# Patient Record
Sex: Female | Born: 1955 | Race: White | Hispanic: No | Marital: Married | Smoking: Never smoker
Health system: Southern US, Community
[De-identification: ages and names within clinical notes are randomized; demographics above are authoritative.]

## PROBLEM LIST (undated history)

## (undated) DIAGNOSIS — I8390 Asymptomatic varicose veins of unspecified lower extremity: Secondary | ICD-10-CM

## (undated) HISTORY — PX: KNEE DEBRIDEMENT: SHX1894

## (undated) HISTORY — DX: Asymptomatic varicose veins of unspecified lower extremity: I83.90

---

## 1999-12-27 ENCOUNTER — Ambulatory Visit (HOSPITAL_COMMUNITY): Admission: RE | Admit: 1999-12-27 | Discharge: 1999-12-27 | Payer: Self-pay | Admitting: Gastroenterology

## 2000-05-28 ENCOUNTER — Encounter: Admission: RE | Admit: 2000-05-28 | Discharge: 2000-05-28 | Payer: Self-pay | Admitting: Family Medicine

## 2000-05-28 ENCOUNTER — Encounter: Payer: Self-pay | Admitting: Family Medicine

## 2001-05-30 ENCOUNTER — Encounter: Admission: RE | Admit: 2001-05-30 | Discharge: 2001-05-30 | Payer: Self-pay | Admitting: Family Medicine

## 2001-05-30 ENCOUNTER — Encounter: Payer: Self-pay | Admitting: Family Medicine

## 2001-12-30 ENCOUNTER — Ambulatory Visit: Admission: RE | Admit: 2001-12-30 | Discharge: 2001-12-30 | Payer: Self-pay | Admitting: Family Medicine

## 2002-06-02 ENCOUNTER — Encounter: Admission: RE | Admit: 2002-06-02 | Discharge: 2002-06-02 | Payer: Self-pay | Admitting: Family Medicine

## 2002-06-02 ENCOUNTER — Encounter: Payer: Self-pay | Admitting: Family Medicine

## 2003-09-16 ENCOUNTER — Encounter: Admission: RE | Admit: 2003-09-16 | Discharge: 2003-09-16 | Payer: Self-pay | Admitting: Family Medicine

## 2003-10-14 ENCOUNTER — Encounter: Admission: RE | Admit: 2003-10-14 | Discharge: 2003-10-14 | Payer: Self-pay | Admitting: *Deleted

## 2004-09-16 ENCOUNTER — Encounter: Admission: RE | Admit: 2004-09-16 | Discharge: 2004-09-16 | Payer: Self-pay | Admitting: Family Medicine

## 2005-02-03 ENCOUNTER — Encounter (INDEPENDENT_AMBULATORY_CARE_PROVIDER_SITE_OTHER): Payer: Self-pay | Admitting: *Deleted

## 2005-02-03 ENCOUNTER — Ambulatory Visit (HOSPITAL_COMMUNITY): Admission: RE | Admit: 2005-02-03 | Discharge: 2005-02-03 | Payer: Self-pay | Admitting: Gastroenterology

## 2005-10-13 ENCOUNTER — Encounter: Admission: RE | Admit: 2005-10-13 | Discharge: 2005-10-13 | Payer: Self-pay | Admitting: Family Medicine

## 2006-10-16 ENCOUNTER — Encounter: Admission: RE | Admit: 2006-10-16 | Discharge: 2006-10-16 | Payer: Self-pay | Admitting: Family Medicine

## 2007-10-18 ENCOUNTER — Encounter: Admission: RE | Admit: 2007-10-18 | Discharge: 2007-10-18 | Payer: Self-pay | Admitting: Family Medicine

## 2008-10-19 ENCOUNTER — Encounter: Admission: RE | Admit: 2008-10-19 | Discharge: 2008-10-19 | Payer: Self-pay | Admitting: Family Medicine

## 2009-10-18 DIAGNOSIS — K219 Gastro-esophageal reflux disease without esophagitis: Secondary | ICD-10-CM | POA: Insufficient documentation

## 2009-10-18 DIAGNOSIS — I10 Essential (primary) hypertension: Secondary | ICD-10-CM | POA: Insufficient documentation

## 2009-10-18 DIAGNOSIS — E039 Hypothyroidism, unspecified: Secondary | ICD-10-CM | POA: Insufficient documentation

## 2009-10-18 DIAGNOSIS — Z79899 Other long term (current) drug therapy: Secondary | ICD-10-CM | POA: Insufficient documentation

## 2009-10-18 DIAGNOSIS — E785 Hyperlipidemia, unspecified: Secondary | ICD-10-CM | POA: Insufficient documentation

## 2009-10-20 ENCOUNTER — Encounter: Admission: RE | Admit: 2009-10-20 | Discharge: 2009-10-20 | Payer: Self-pay | Admitting: Family Medicine

## 2010-10-21 ENCOUNTER — Encounter: Admission: RE | Admit: 2010-10-21 | Discharge: 2010-10-21 | Payer: Self-pay | Admitting: Family Medicine

## 2011-04-07 NOTE — Op Note (Signed)
Sarah Atkins, Sarah Atkins              ACCOUNT NO.:  [id number]   MEDICAL RECORD NO.:  [id number]          PATIENT TYPE:  AMB   LOCATION:  ENDO                         FACILITY:  [facility]   PHYSICIAN:  James L. Malon Kindle., M.D.DATE OF BIRTH:  07/25/1956   DATE OF PROCEDURE:  02/03/2005  DATE OF DISCHARGE:                                 OPERATIVE REPORT   PROCEDURE:  Colonoscopy and coagulation of polyp.   MEDICATIONS:  Fentanyl 75 mcg, Versed 8 mg IV.   SCOPE:  Olympus pediatric adjustable colonoscope.   INDICATIONS:  Patient's father had colon cancer at an age less than 60.  She  had a colonoscopy that was negative five years ago. This is done as a five-  year followup.   DESCRIPTION OF PROCEDURE:  The procedure has been explained to the patient  and consent obtained.  With the patient in the left lateral decubitus  position, the Olympus scope was inserted and advanced.  The prep was  excellent.  I was able to reach the cecum without difficulty using minimal  abdominal pressure.  The ileocecal valve and appendiceal orifice was seen.  In the cecum, some distance from the appendiceal orifice, a 3 mm sessile  polyp was encountered.  It was tented up with a hot biopsy forceps, and a  small amount of cautery applied.  The polyp biopsied.  There was a small  white coagulum.  The scope was withdrawn.  No other polyps were seen in the  ascending or transverse colon.  The splenic flexure, descending and sigmoid  colon were seen well and were free of polyps.  There was no significant  diverticular disease.  The scope was withdrawn down into the rectum, and the  rectum was free of polyps.  The patient tolerated the procedure well.   ASSESSMENT:  Polyp in the cecum, cauterized.  211.3.   PLAN:  Routine post-polypectomy instructions.  Will recommend repeating  procedure in five years.      JLE/MEDQ  D:  02/03/2005  T:  02/03/2005  Job:  [id number]   cc:   Tera Mater. Evlyn Kanner, M.D.  [address]  [place]  [place] [address]  Fax: [phone]

## 2011-04-07 NOTE — Procedures (Signed)
[place]  Patient:    Sarah Atkins                        MRN: [id number] Proc. Date: 12/27/99 Adm. Date:  [id number] Attending:  Orland Mustard CC:         Tera Mater. Evlyn Kanner, M.D.                           Procedure Report  PROCEDURE:  Colonoscopy.  ENDOSCOPIST:  Llana Aliment. Edwards, M.D.  MEDICATIONS:  Fentanyl 100 mcg, Versed 2 mg IV.  SCOPE:  Pediatric Olympus video colonoscope.  INDICATIONS:  A strong family history of colon cancer.  Her father had colon cancer at less than age 4.  DESCRIPTION OF PROCEDURE:  The procedure had been explained to the patient and  consent obtained.  With the patient in the left lateral decubitus position, the  Olympus pediatric video colonoscope was inserted and advanced under direct visualization.  The prep was excellent and we were able to advance around to the cecum.  The right lower quadrant was transilluminated and the ileocecal valve was seen.  The scope was withdrawn.  The cecum, ascending colon, hepatic flexure, transverse colon, splenic flexure, descending colon, and sigmoid colon were seen well upon removal.  No polyps or other lesions were seen.  Internal hemorrhoids  were seen in the rectum upon removal of the scope.  The scope was withdrawn. The patient tolerated the procedure well.  ASSESSMENT: 1. Internal hemorrhoids. 2. No evidence of polyps.  PLAN:  Due to a strong family history, we recommended repeating in five years. DD:  12/27/99 TD:  12/27/99 Job: [id number]

## 2011-09-21 ENCOUNTER — Other Ambulatory Visit: Payer: Self-pay | Admitting: Family Medicine

## 2011-09-21 DIAGNOSIS — Z1231 Encounter for screening mammogram for malignant neoplasm of breast: Secondary | ICD-10-CM

## 2011-10-25 ENCOUNTER — Ambulatory Visit
Admission: RE | Admit: 2011-10-25 | Discharge: 2011-10-25 | Disposition: A | Discharge status: Home / Self Care | Payer: PRIVATE HEALTH INSURANCE | Source: Ambulatory Visit | Attending: Family Medicine | Admitting: Family Medicine

## 2011-10-25 DIAGNOSIS — Z1231 Encounter for screening mammogram for malignant neoplasm of breast: Secondary | ICD-10-CM

## 2011-12-21 DIAGNOSIS — M1990 Unspecified osteoarthritis, unspecified site: Secondary | ICD-10-CM | POA: Insufficient documentation

## 2012-09-19 ENCOUNTER — Other Ambulatory Visit: Payer: Self-pay | Admitting: Family Medicine

## 2012-09-19 DIAGNOSIS — Z1231 Encounter for screening mammogram for malignant neoplasm of breast: Secondary | ICD-10-CM

## 2012-10-28 ENCOUNTER — Ambulatory Visit: Payer: PRIVATE HEALTH INSURANCE

## 2012-11-01 ENCOUNTER — Ambulatory Visit
Admission: RE | Admit: 2012-11-01 | Discharge: 2012-11-01 | Disposition: A | Discharge status: Home / Self Care | Payer: PRIVATE HEALTH INSURANCE | Source: Ambulatory Visit | Attending: Family Medicine | Admitting: Family Medicine

## 2012-11-01 DIAGNOSIS — Z1231 Encounter for screening mammogram for malignant neoplasm of breast: Secondary | ICD-10-CM

## 2013-09-22 ENCOUNTER — Other Ambulatory Visit: Payer: Self-pay

## 2013-09-22 DIAGNOSIS — Z1231 Encounter for screening mammogram for malignant neoplasm of breast: Secondary | ICD-10-CM

## 2013-11-07 ENCOUNTER — Ambulatory Visit: Payer: PRIVATE HEALTH INSURANCE

## 2013-11-10 ENCOUNTER — Ambulatory Visit
Admission: RE | Admit: 2013-11-10 | Discharge: 2013-11-10 | Disposition: A | Discharge status: Home / Self Care | Payer: No Typology Code available for payment source | Source: Ambulatory Visit

## 2013-11-10 DIAGNOSIS — Z1231 Encounter for screening mammogram for malignant neoplasm of breast: Secondary | ICD-10-CM

## 2014-09-01 ENCOUNTER — Other Ambulatory Visit: Payer: Self-pay

## 2014-09-01 ENCOUNTER — Encounter: Payer: Self-pay | Admitting: Vascular Surgery

## 2014-09-01 DIAGNOSIS — M7989 Other specified soft tissue disorders: Secondary | ICD-10-CM

## 2014-09-01 DIAGNOSIS — I8390 Asymptomatic varicose veins of unspecified lower extremity: Secondary | ICD-10-CM

## 2014-09-16 ENCOUNTER — Encounter: Payer: Self-pay | Admitting: Vascular Surgery

## 2014-09-17 ENCOUNTER — Encounter: Payer: Self-pay | Admitting: Vascular Surgery

## 2014-09-17 ENCOUNTER — Ambulatory Visit (HOSPITAL_COMMUNITY)
Admission: RE | Admit: 2014-09-17 | Discharge: 2014-09-17 | Disposition: A | Discharge status: Home / Self Care | Payer: No Typology Code available for payment source | Source: Ambulatory Visit | Attending: Vascular Surgery | Admitting: Vascular Surgery

## 2014-09-17 ENCOUNTER — Ambulatory Visit (INDEPENDENT_AMBULATORY_CARE_PROVIDER_SITE_OTHER): Payer: No Typology Code available for payment source | Admitting: Vascular Surgery

## 2014-09-17 VITALS — BP 114/80 | HR 62 | Ht 64.0 in | Wt 196.1 lb

## 2014-09-17 DIAGNOSIS — I8390 Asymptomatic varicose veins of unspecified lower extremity: Secondary | ICD-10-CM | POA: Diagnosis present

## 2014-09-17 DIAGNOSIS — I83899 Varicose veins of unspecified lower extremities with other complications: Secondary | ICD-10-CM

## 2014-09-17 DIAGNOSIS — M7989 Other specified soft tissue disorders: Secondary | ICD-10-CM | POA: Diagnosis present

## 2014-09-17 NOTE — Progress Notes (Signed)
[facility] [place] HISTORY AND PHYSICAL   History of Present Illness:  Patient is a 58 y.o. year old female who presents for evaluation of recurrent varicose veins right leg. The patient had a vein stripping in her right leg approximately 30 years ago. She has developed slow progressive recurrence of varicose veins in her right leg. She states that her legs swell as the day progresses. She develops fullness achiness and heaviness in the legs. She develops burning and stinging symptoms around the right ankle. She does have some mild similar symptoms in the left leg but the right leg is much worse. She has intermittently worn compression stockings as high as the pantyhose level and as low as knee level. She does get some relief but not full relief from these. She also takes Aleve and elevates her legs to reduce her symptoms. She denies any prior history of DVT. She denies family history. Other medical problems include hypertension which is well-controlled. She has also lost 25 pounds over the last few years intentionally.  Past Medical History  Diagnosis Date  . Varicose veins     Past Surgical History  Procedure Laterality Date  . Knee debridement Right     Social History History  Substance Use Topics  . Smoking status: Never Smoker   . Smokeless tobacco: Never Used  . Alcohol Use: No    Family History Family History  Problem Relation Age of Onset  . Diabetes Mother   . Cancer Mother   . Hypertension Mother   . Hypertension Father     Allergies  No Known Allergies   Current Outpatient Prescriptions  Medication Sig Dispense Refill  . benazepril (LOTENSIN) 20 MG tablet Take 20 mg by mouth daily.      . Cholecalciferol (VITAMIN D3) 2000 UNITS TABS Take 1 tablet by mouth once a week.      . hydrochlorothiazide (HYDRODIURIL) 25 MG tablet Take 25 mg by mouth daily.      Marland Kitchen levothyroxine (SYNTHROID, LEVOTHROID) 175 MCG tablet Take 175 mcg by mouth daily before  breakfast.      . Multiple Vitamin (MULTIVITAMIN) tablet Take 1 tablet by mouth daily.      . naproxen sodium (ANAPROX) 220 MG tablet Take 220 mg by mouth 2 (two) times daily with a meal.       No current facility-administered medications for this visit.    ROS:   General:   + weight loss, Fever, chills  HEENT: No recent headaches, no nasal bleeding, no visual changes, no sore throat  Neurologic: No dizziness, blackouts, seizures. No recent symptoms of stroke or mini- stroke. No recent episodes of slurred speech, or temporary blindness.  Cardiac: No recent episodes of chest pain/pressure, no shortness of breath at rest.  No shortness of breath with exertion.  Denies history of atrial fibrillation or irregular heartbeat  Vascular: No history of rest pain in feet.  No history of claudication.  No history of non-healing ulcer, No history of DVT   Pulmonary: No home oxygen, no productive cough, no hemoptysis,  No asthma or wheezing  Musculoskeletal:  [ ]  Arthritis, [ ]  Low back pain,  [ ]  Joint pain  Hematologic:No history of hypercoagulable state.  No history of easy bleeding.  No history of anemia  Gastrointestinal: No hematochezia or melena,  No gastroesophageal reflux, no trouble swallowing  Urinary: [ ]  chronic Kidney disease, [ ]  on HD - [ ]  MWF or [ ]  TTHS, [ ]  Burning  with urination, [ ]  Frequent urination, [ ]  Difficulty urinating;   Skin: No rashes  Psychological: No history of anxiety,  No history of depression   Physical Examination  Filed Vitals:   09/17/14 1030  BP: 114/80  Pulse: 62  Height: 5\' 4"  (1.626 m)  Weight: 196 lb 1.6 oz (88.95 kg)  SpO2: 100%    Body mass index is 33.64 kg/(m^2).  General:  Alert and oriented, no acute distress HEENT: Normal Neck: No bruit or JVD Pulmonary: Clear to auscultation bilaterally Cardiac: Regular Rate and Rhythm without murmur Abdomen: Soft, non-tender, non-distended, no mass Skin: No rash, diffuse 4-6 mm scattered  varicosities in the right anterior thigh and right medial calf right posterior knee and over the course of the left greater saphenous vein also diffuse multiple patches of reticular and spider-type veins on the anterior thigh both legs and the ankle both sides right greater than left Extremity Pulses:  2+ radial, brachial, femoral, dorsalis pedis pulses bilaterally Musculoskeletal: No deformity or edema  Neurologic: Upper and lower extremity motor 5/5 and symmetric  DATA: Patient had a venous duplex exam today which showed multiple large recurrent varicosities in the right leg 4-7 mm diameter the native greater saphenous was absent and she also had evidence of deep vein reflux. The right saphenofemoral junction was 9 mm.  In the left lower extremity she also had deep vein reflux. She also had reflux in her left greater saphenous vein 6-7 mm diameter   ASSESSMENT:  Recurrent varicose veins right lower extremity. She was given a prescription today for thigh-high 20-30 mm compression stockings to see if she can get relief from the thigh-high type stockings. She will follow up with [place] in 3 months to consider stab avulsion of several of these varicosities. In the right leg she might not be a candidate for the laser due to severe tortuosity. However several of these varicosities seemed to be in continuity. She may also be a candidate for ligation of her saphenofemoral junction. Currently the right leg bothers her the most and she is most interested in treating this. She will continue with leg elevation and nonsteroidal anti-inflammatories for symptomatic relief.  PLAN:  See above  Ruta Hinds, MD [facility] [place] Office: [phone] Pager: [phone]

## 2014-10-09 ENCOUNTER — Other Ambulatory Visit: Payer: Self-pay

## 2014-10-09 DIAGNOSIS — Z1231 Encounter for screening mammogram for malignant neoplasm of breast: Secondary | ICD-10-CM

## 2014-10-12 DIAGNOSIS — K635 Polyp of colon: Secondary | ICD-10-CM | POA: Insufficient documentation

## 2014-10-12 DIAGNOSIS — I8390 Asymptomatic varicose veins of unspecified lower extremity: Secondary | ICD-10-CM | POA: Insufficient documentation

## 2014-11-19 ENCOUNTER — Ambulatory Visit
Admission: RE | Admit: 2014-11-19 | Discharge: 2014-11-19 | Disposition: A | Discharge status: Home / Self Care | Payer: PRIVATE HEALTH INSURANCE | Source: Ambulatory Visit

## 2014-11-19 DIAGNOSIS — Z1231 Encounter for screening mammogram for malignant neoplasm of breast: Secondary | ICD-10-CM

## 2014-12-21 ENCOUNTER — Encounter: Payer: Self-pay | Admitting: Vascular Surgery

## 2014-12-22 ENCOUNTER — Ambulatory Visit: Payer: No Typology Code available for payment source | Admitting: Vascular Surgery

## 2015-10-11 ENCOUNTER — Other Ambulatory Visit: Payer: Self-pay

## 2015-10-11 DIAGNOSIS — Z1231 Encounter for screening mammogram for malignant neoplasm of breast: Secondary | ICD-10-CM

## 2015-11-16 ENCOUNTER — Ambulatory Visit
Admission: RE | Admit: 2015-11-16 | Discharge: 2015-11-16 | Disposition: A | Discharge status: Home / Self Care | Payer: 59 | Source: Ambulatory Visit

## 2015-11-16 DIAGNOSIS — Z1231 Encounter for screening mammogram for malignant neoplasm of breast: Secondary | ICD-10-CM

## 2016-10-16 ENCOUNTER — Other Ambulatory Visit: Payer: Self-pay | Admitting: Endocrinology

## 2016-10-16 DIAGNOSIS — Z1231 Encounter for screening mammogram for malignant neoplasm of breast: Secondary | ICD-10-CM

## 2016-11-16 ENCOUNTER — Ambulatory Visit
Admission: RE | Admit: 2016-11-16 | Discharge: 2016-11-16 | Disposition: A | Discharge status: Home / Self Care | Payer: BLUE CROSS/BLUE SHIELD | Source: Ambulatory Visit | Attending: Endocrinology | Admitting: Endocrinology

## 2016-11-16 DIAGNOSIS — Z1231 Encounter for screening mammogram for malignant neoplasm of breast: Secondary | ICD-10-CM

## 2017-10-05 ENCOUNTER — Other Ambulatory Visit: Payer: Self-pay | Admitting: Endocrinology

## 2017-10-05 DIAGNOSIS — Z1231 Encounter for screening mammogram for malignant neoplasm of breast: Secondary | ICD-10-CM

## 2017-10-23 DIAGNOSIS — E310 Autoimmune polyglandular failure: Secondary | ICD-10-CM | POA: Insufficient documentation

## 2017-10-23 DIAGNOSIS — D751 Secondary polycythemia: Secondary | ICD-10-CM | POA: Insufficient documentation

## 2017-11-19 ENCOUNTER — Ambulatory Visit: Payer: BLUE CROSS/BLUE SHIELD

## 2017-12-10 ENCOUNTER — Ambulatory Visit
Admission: RE | Admit: 2017-12-10 | Discharge: 2017-12-10 | Disposition: A | Discharge status: Home / Self Care | Payer: BLUE CROSS/BLUE SHIELD | Source: Ambulatory Visit | Attending: Endocrinology | Admitting: Endocrinology

## 2017-12-10 DIAGNOSIS — Z1231 Encounter for screening mammogram for malignant neoplasm of breast: Secondary | ICD-10-CM

## 2018-10-21 DIAGNOSIS — Z0000 Encounter for general adult medical examination without abnormal findings: Secondary | ICD-10-CM | POA: Insufficient documentation

## 2018-10-31 ENCOUNTER — Other Ambulatory Visit: Payer: Self-pay | Admitting: Endocrinology

## 2018-10-31 DIAGNOSIS — Z1231 Encounter for screening mammogram for malignant neoplasm of breast: Secondary | ICD-10-CM

## 2018-12-11 ENCOUNTER — Ambulatory Visit
Admission: RE | Admit: 2018-12-11 | Discharge: 2018-12-11 | Disposition: A | Discharge status: Home / Self Care | Payer: BLUE CROSS/BLUE SHIELD | Source: Ambulatory Visit | Attending: Endocrinology | Admitting: Endocrinology

## 2018-12-11 DIAGNOSIS — Z1231 Encounter for screening mammogram for malignant neoplasm of breast: Secondary | ICD-10-CM

## 2019-01-26 IMAGING — MG DIGITAL SCREENING BILATERAL MAMMOGRAM WITH CAD
6 series · 6 of 6 positions shown · non-contrast
Comparison: Previous exam(s).

CLINICAL DATA: Screening.

EXAM:
DIGITAL SCREENING BILATERAL MAMMOGRAM WITH CAD

[L MLO (1 of 2)]
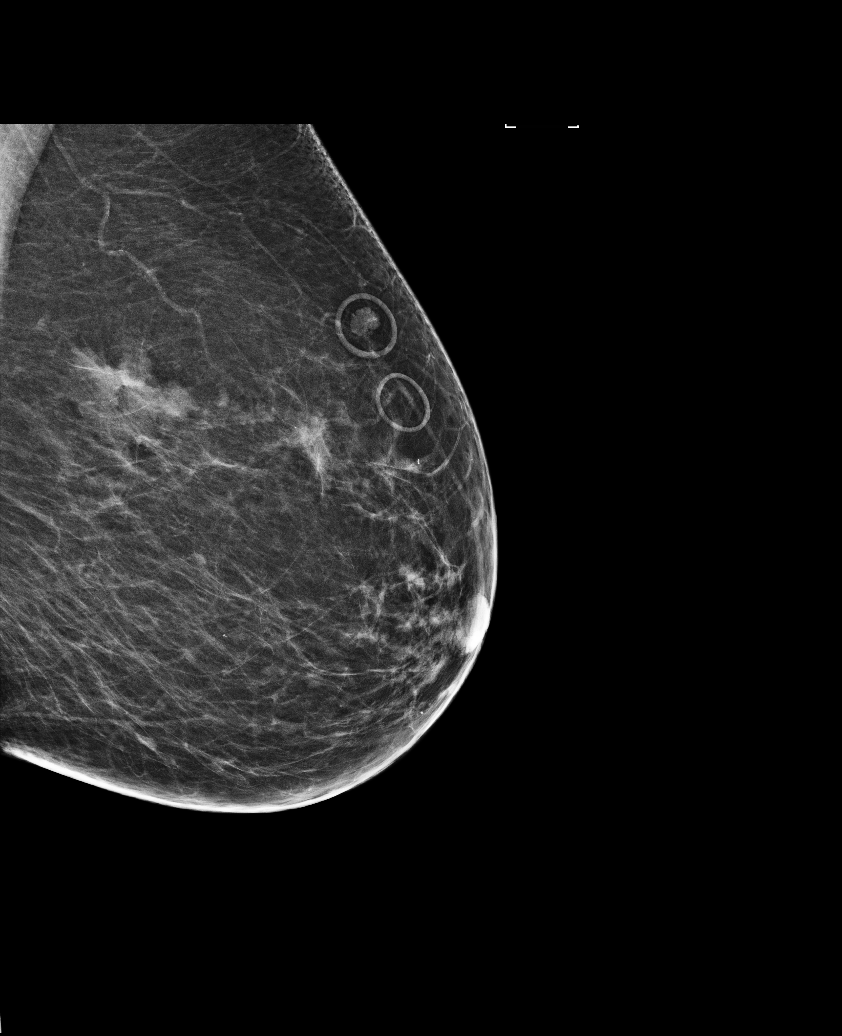

[L CC]
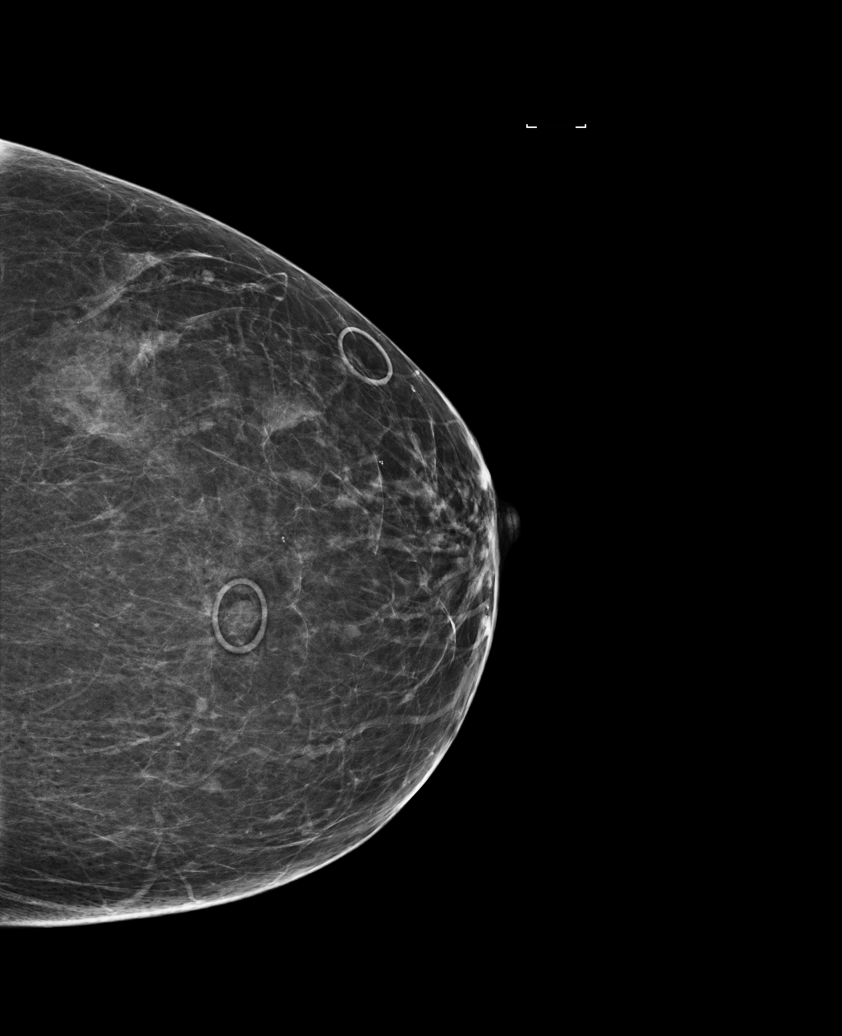

[R MLO (1 of 2)]
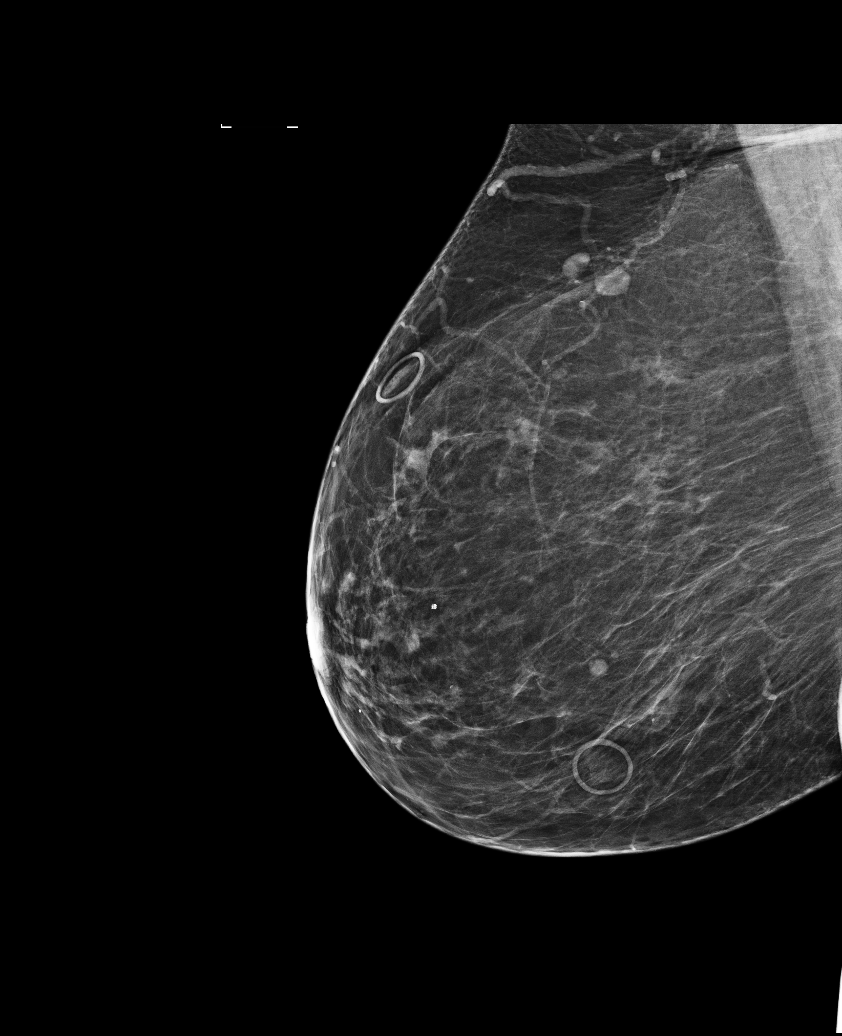

[R MLO (2 of 2)]
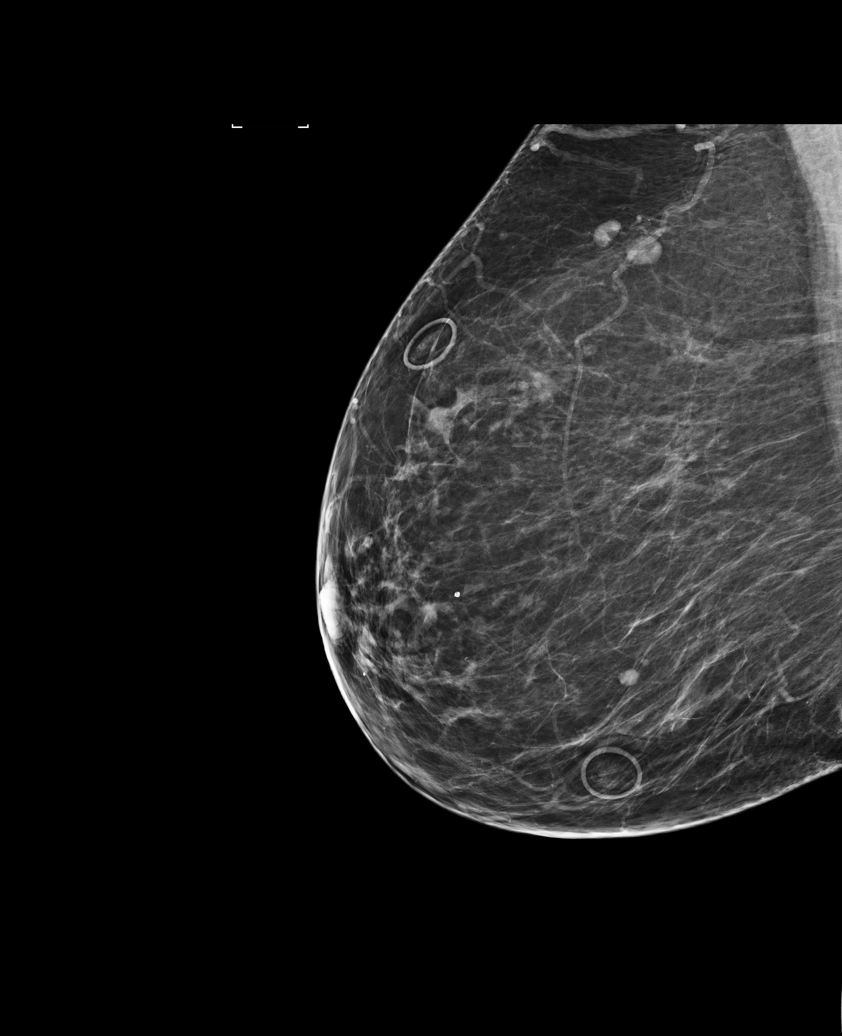

[L MLO (2 of 2)]
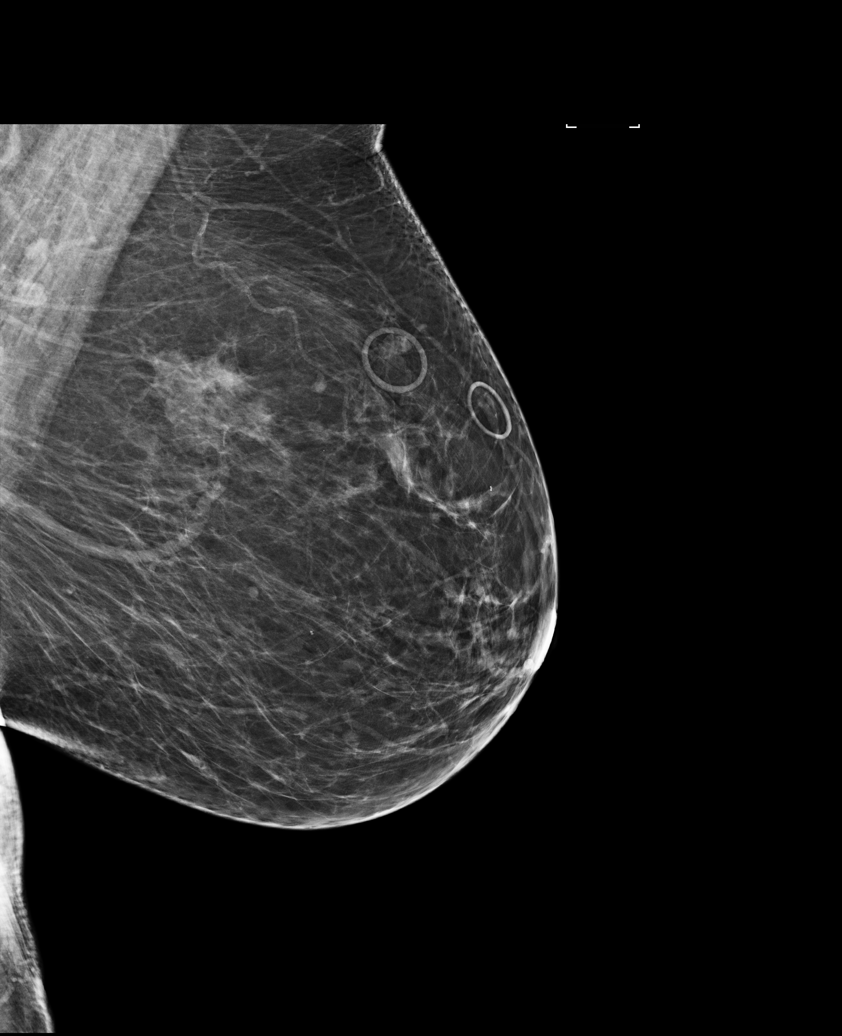

[R CC]
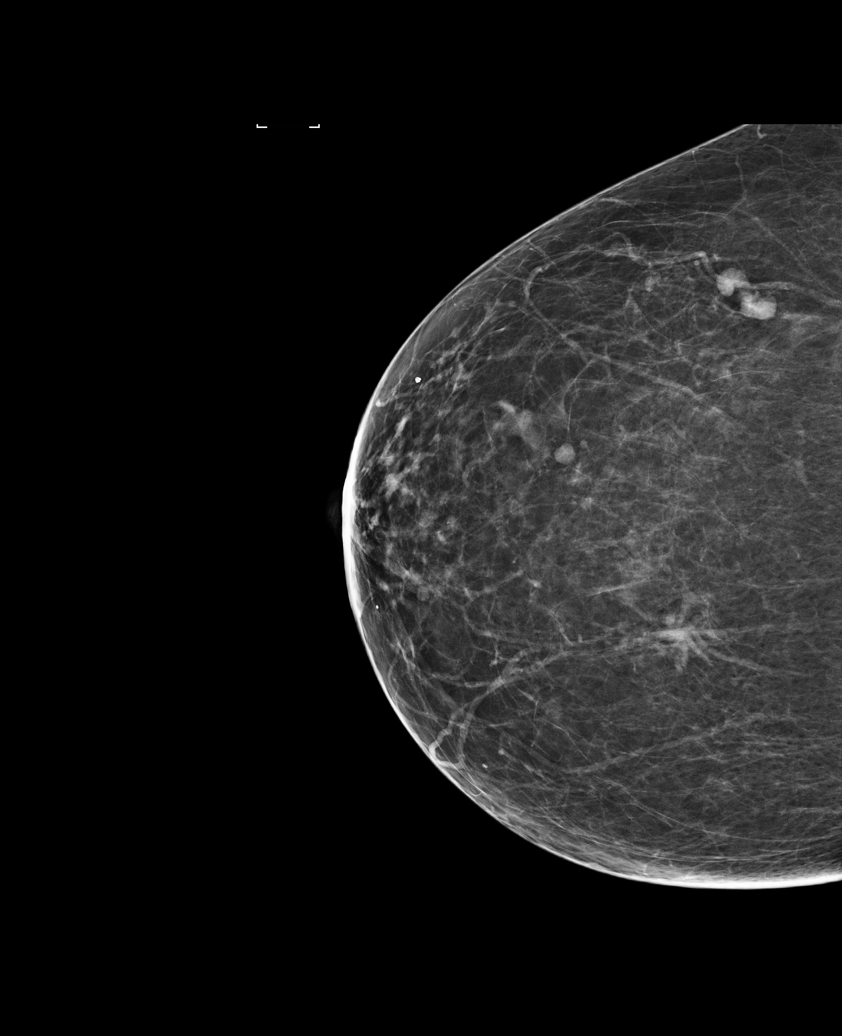

[6 of 6 positions shown; findings below may reference images not displayed]

ACR Breast Density Category b: There are scattered areas of
fibroglandular density.
FINDINGS: There are no findings suspicious for malignancy. Images were
processed with CAD.
IMPRESSION: No mammographic evidence of malignancy. A result letter of this
screening mammogram will be mailed directly to the patient.

RECOMMENDATION:
Screening mammogram in one year. (Code:AS-G-LCT)

BI-RADS CATEGORY  1: Negative.

## 2021-05-27 DIAGNOSIS — M1711 Unilateral primary osteoarthritis, right knee: Secondary | ICD-10-CM | POA: Diagnosis not present

## 2021-08-03 DIAGNOSIS — M7061 Trochanteric bursitis, right hip: Secondary | ICD-10-CM | POA: Diagnosis not present

## 2021-08-03 DIAGNOSIS — M545 Low back pain, unspecified: Secondary | ICD-10-CM | POA: Diagnosis not present

## 2021-08-22 DIAGNOSIS — M1711 Unilateral primary osteoarthritis, right knee: Secondary | ICD-10-CM | POA: Diagnosis not present

## 2021-09-19 DIAGNOSIS — R0782 Intercostal pain: Secondary | ICD-10-CM | POA: Diagnosis not present

## 2021-09-19 DIAGNOSIS — R198 Other specified symptoms and signs involving the digestive system and abdomen: Secondary | ICD-10-CM | POA: Diagnosis not present

## 2021-10-06 DIAGNOSIS — M1711 Unilateral primary osteoarthritis, right knee: Secondary | ICD-10-CM | POA: Diagnosis not present

## 2021-10-27 DIAGNOSIS — I10 Essential (primary) hypertension: Secondary | ICD-10-CM | POA: Diagnosis not present

## 2021-10-27 DIAGNOSIS — E785 Hyperlipidemia, unspecified: Secondary | ICD-10-CM | POA: Diagnosis not present

## 2021-10-27 DIAGNOSIS — E039 Hypothyroidism, unspecified: Secondary | ICD-10-CM | POA: Diagnosis not present

## 2021-11-03 DIAGNOSIS — N1831 Chronic kidney disease, stage 3a: Secondary | ICD-10-CM | POA: Diagnosis not present

## 2021-11-03 DIAGNOSIS — E310 Autoimmune polyglandular failure: Secondary | ICD-10-CM | POA: Diagnosis not present

## 2021-11-03 DIAGNOSIS — I10 Essential (primary) hypertension: Secondary | ICD-10-CM | POA: Diagnosis not present

## 2021-11-03 DIAGNOSIS — Z0000 Encounter for general adult medical examination without abnormal findings: Secondary | ICD-10-CM | POA: Diagnosis not present

## 2021-11-03 DIAGNOSIS — Z79899 Other long term (current) drug therapy: Secondary | ICD-10-CM | POA: Diagnosis not present

## 2021-11-03 DIAGNOSIS — M1990 Unspecified osteoarthritis, unspecified site: Secondary | ICD-10-CM | POA: Diagnosis not present

## 2021-11-03 DIAGNOSIS — Z23 Encounter for immunization: Secondary | ICD-10-CM | POA: Diagnosis not present

## 2021-11-03 DIAGNOSIS — E039 Hypothyroidism, unspecified: Secondary | ICD-10-CM | POA: Diagnosis not present

## 2021-11-03 DIAGNOSIS — R82998 Other abnormal findings in urine: Secondary | ICD-10-CM | POA: Diagnosis not present

## 2021-11-03 DIAGNOSIS — Z1212 Encounter for screening for malignant neoplasm of rectum: Secondary | ICD-10-CM | POA: Diagnosis not present

## 2021-11-03 DIAGNOSIS — E785 Hyperlipidemia, unspecified: Secondary | ICD-10-CM | POA: Diagnosis not present

## 2021-11-24 DIAGNOSIS — M25561 Pain in right knee: Secondary | ICD-10-CM | POA: Diagnosis not present

## 2021-11-24 DIAGNOSIS — G8929 Other chronic pain: Secondary | ICD-10-CM | POA: Diagnosis not present

## 2021-11-30 DIAGNOSIS — Z01818 Encounter for other preprocedural examination: Secondary | ICD-10-CM

## 2021-11-30 DIAGNOSIS — Z79899 Other long term (current) drug therapy: Secondary | ICD-10-CM | POA: Diagnosis not present

## 2021-11-30 DIAGNOSIS — M79609 Pain in unspecified limb: Secondary | ICD-10-CM | POA: Diagnosis not present

## 2021-11-30 DIAGNOSIS — E559 Vitamin D deficiency, unspecified: Secondary | ICD-10-CM | POA: Diagnosis not present

## 2022-01-05 DIAGNOSIS — R52 Pain, unspecified: Secondary | ICD-10-CM | POA: Diagnosis not present

## 2022-01-05 DIAGNOSIS — Z79899 Other long term (current) drug therapy: Secondary | ICD-10-CM | POA: Diagnosis not present

## 2022-01-05 DIAGNOSIS — M79603 Pain in arm, unspecified: Secondary | ICD-10-CM | POA: Diagnosis not present

## 2022-01-09 DIAGNOSIS — R2689 Other abnormalities of gait and mobility: Secondary | ICD-10-CM | POA: Diagnosis not present

## 2022-01-09 DIAGNOSIS — Z471 Aftercare following joint replacement surgery: Secondary | ICD-10-CM | POA: Diagnosis not present

## 2022-01-09 DIAGNOSIS — M1711 Unilateral primary osteoarthritis, right knee: Secondary | ICD-10-CM | POA: Diagnosis not present

## 2022-01-09 DIAGNOSIS — E119 Type 2 diabetes mellitus without complications: Secondary | ICD-10-CM | POA: Diagnosis not present

## 2022-01-09 DIAGNOSIS — R600 Localized edema: Secondary | ICD-10-CM | POA: Diagnosis not present

## 2022-01-09 DIAGNOSIS — Z9889 Other specified postprocedural states: Secondary | ICD-10-CM | POA: Diagnosis not present

## 2022-01-09 DIAGNOSIS — G8918 Other acute postprocedural pain: Secondary | ICD-10-CM | POA: Diagnosis not present

## 2022-01-09 DIAGNOSIS — Z96651 Presence of right artificial knee joint: Secondary | ICD-10-CM | POA: Diagnosis not present

## 2022-01-10 DIAGNOSIS — G8918 Other acute postprocedural pain: Secondary | ICD-10-CM | POA: Diagnosis not present

## 2022-01-10 DIAGNOSIS — M1711 Unilateral primary osteoarthritis, right knee: Secondary | ICD-10-CM | POA: Diagnosis not present

## 2022-01-10 DIAGNOSIS — R2689 Other abnormalities of gait and mobility: Secondary | ICD-10-CM | POA: Diagnosis not present

## 2022-01-10 DIAGNOSIS — E119 Type 2 diabetes mellitus without complications: Secondary | ICD-10-CM | POA: Diagnosis not present

## 2022-01-17 ENCOUNTER — Ambulatory Visit: Payer: Medicare HMO | Admitting: Sports Medicine

## 2022-01-25 DIAGNOSIS — M25661 Stiffness of right knee, not elsewhere classified: Secondary | ICD-10-CM | POA: Diagnosis not present

## 2022-01-25 DIAGNOSIS — M25461 Effusion, right knee: Secondary | ICD-10-CM | POA: Diagnosis not present

## 2022-01-25 DIAGNOSIS — M25561 Pain in right knee: Secondary | ICD-10-CM | POA: Diagnosis not present

## 2022-01-25 DIAGNOSIS — M6281 Muscle weakness (generalized): Secondary | ICD-10-CM | POA: Diagnosis not present

## 2022-01-25 DIAGNOSIS — R2689 Other abnormalities of gait and mobility: Secondary | ICD-10-CM | POA: Diagnosis not present

## 2022-01-31 DIAGNOSIS — R2689 Other abnormalities of gait and mobility: Secondary | ICD-10-CM | POA: Diagnosis not present

## 2022-01-31 DIAGNOSIS — M25661 Stiffness of right knee, not elsewhere classified: Secondary | ICD-10-CM | POA: Diagnosis not present

## 2022-01-31 DIAGNOSIS — M25561 Pain in right knee: Secondary | ICD-10-CM | POA: Diagnosis not present

## 2022-01-31 DIAGNOSIS — M6281 Muscle weakness (generalized): Secondary | ICD-10-CM | POA: Diagnosis not present

## 2022-01-31 DIAGNOSIS — M25461 Effusion, right knee: Secondary | ICD-10-CM | POA: Diagnosis not present

## 2022-02-02 DIAGNOSIS — M6281 Muscle weakness (generalized): Secondary | ICD-10-CM | POA: Diagnosis not present

## 2022-02-02 DIAGNOSIS — M25661 Stiffness of right knee, not elsewhere classified: Secondary | ICD-10-CM | POA: Diagnosis not present

## 2022-02-02 DIAGNOSIS — M25461 Effusion, right knee: Secondary | ICD-10-CM | POA: Diagnosis not present

## 2022-02-02 DIAGNOSIS — R2689 Other abnormalities of gait and mobility: Secondary | ICD-10-CM | POA: Diagnosis not present

## 2022-02-02 DIAGNOSIS — M25561 Pain in right knee: Secondary | ICD-10-CM | POA: Diagnosis not present

## 2022-02-07 ENCOUNTER — Encounter: Payer: Self-pay | Admitting: Sports Medicine

## 2022-02-07 ENCOUNTER — Ambulatory Visit: Payer: Medicare HMO | Admitting: Sports Medicine

## 2022-02-07 ENCOUNTER — Other Ambulatory Visit: Payer: Self-pay

## 2022-02-07 DIAGNOSIS — M79674 Pain in right toe(s): Secondary | ICD-10-CM | POA: Diagnosis not present

## 2022-02-07 DIAGNOSIS — I739 Peripheral vascular disease, unspecified: Secondary | ICD-10-CM

## 2022-02-07 DIAGNOSIS — I8393 Asymptomatic varicose veins of bilateral lower extremities: Secondary | ICD-10-CM

## 2022-02-07 DIAGNOSIS — M79675 Pain in left toe(s): Secondary | ICD-10-CM

## 2022-02-07 DIAGNOSIS — B351 Tinea unguium: Secondary | ICD-10-CM | POA: Diagnosis not present

## 2022-02-07 DIAGNOSIS — N1831 Chronic kidney disease, stage 3a: Secondary | ICD-10-CM | POA: Insufficient documentation

## 2022-02-07 NOTE — Progress Notes (Signed)
Subjective: ?Sarah Atkins is a 66 y.o. female patient seen today in office with complaint of mildly painful thickened and elongated toenails; unable to trim. Patient denies history of PVD/vein issues currently uses compression garments and recently had right knee surgery using TED hose. ? ?Patient Active Problem List  ? Diagnosis Date Noted  ? Chronic kidney disease, stage 3a ([place]) 02/07/2022  ? Encounter for general adult medical examination without abnormal findings 10/21/2018  ? 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 Polyp of colon 10/12/2014  ? Varicose veins of lower extremity 10/12/2014  ? Osteoarthritis 12/21/2011  ? Essential hypertension 10/18/2009  ? Gastro-esophageal reflux disease without esophagitis 10/18/2009  ? Hyperlipidemia 10/18/2009  ? Hypothyroidism 10/18/2009  ? Other long term (current) drug therapy 10/18/2009  ? ? ?Current Outpatient Medications on File Prior to Visit  ?Medication Sig Dispense Refill  ? benazepril (LOTENSIN) 20 MG tablet Take 20 mg by mouth daily.    ? benazepril (LOTENSIN) 20 MG tablet Take 1 tablet by mouth daily.    ? Cholecalciferol (VITAMIN D3) 50 MCG (2000 UT) TABS Take 4 tablets by mouth daily.    ? gabapentin (NEURONTIN) 300 MG capsule Take 300 mg by mouth 3 (three) times daily.    ? GOODSENSE PAIN RELIEF 325 MG tablet Take 325 mg by mouth 2 (two) times daily.    ? GOODSENSE STIMULANT LAXATIVE 8.6-50 MG tablet Take 2 tablets by mouth at bedtime.    ? hydrochlorothiazide (HYDRODIURIL) 25 MG tablet Take 25 mg by mouth daily.    ? levothyroxine (SYNTHROID, LEVOTHROID) 175 MCG tablet Take 175 mcg by mouth daily before breakfast.    ? Multiple Vitamin (MULTIVITAMIN) tablet Take 1 tablet by mouth daily.    ? oxybutynin (DITROPAN) 5 MG tablet Take 1 tablet by mouth daily.    ? Oyster Shell Calcium 500 MG TABS Take 1 tablet by mouth 2 (two) times daily.    ? ?No current facility-administered medications on file prior  to visit.  ? ? ?No Known Allergies ? ?Objective: ?Physical Exam ? ?General: Well developed, nourished, no acute distress, awake, alert and oriented x 3 ? ?Vascular: Dorsalis pedis artery 1/4 bilateral, Posterior tibial artery 0/4 bilateral, skin temperature warm to warm proximal to distal bilateral lower extremities, severe varicosities, pedal hair present bilateral. ? ?Neurological: Gross sensation present via light touch bilateral.  ? ?Dermatological: Skin is warm, dry, and supple bilateral, Nails 1-10 are tender, long, thick, and discolored with mild subungal debris with mild incurvation at the left hallux lateral margin no acute surrounding signs or symptoms of ingrowing this appears to be chronic, no webspace macerations present bilateral, no open lesions present bilateral, no callus/corns/hyperkeratotic tissue present bilateral. No signs of infection bilateral. ? ?Musculoskeletal: Asymptomatic hammertoe boney deformities noted bilateral. Muscular strength within normal limits without painon range of motion. No pain with calf compression bilateral. ? ?Assessment and Plan:  ?Problem List Items Addressed This Visit   ? ?  ? Cardiovascular and Mediastinum  ? Varicose veins of lower extremity  ? Relevant Medications  ? benazepril (LOTENSIN) 20 MG tablet  ? ?Other Visit Diagnoses   ? ? Pain due to onychomycosis of toenails of both feet    -  Primary  ? PVD (peripheral vascular disease) ([place])      ? Relevant Medications  ? benazepril (LOTENSIN) 20 MG tablet  ? ?  ? ? ?-Examined patient.  ?-Discussed treatment options for painful mycotic nails. ?-Mechanically debrided and  reduced all painful mycotic nails with sterile nail nipper and dremel nail file without incident. ?-Patient to return in 3 months for follow up evaluation or sooner if symptoms worsen. ? ?Landis Martins, DPM  ?

## 2022-02-08 DIAGNOSIS — M25561 Pain in right knee: Secondary | ICD-10-CM | POA: Diagnosis not present

## 2022-02-08 DIAGNOSIS — R2689 Other abnormalities of gait and mobility: Secondary | ICD-10-CM | POA: Diagnosis not present

## 2022-02-08 DIAGNOSIS — M6281 Muscle weakness (generalized): Secondary | ICD-10-CM | POA: Diagnosis not present

## 2022-02-08 DIAGNOSIS — M25461 Effusion, right knee: Secondary | ICD-10-CM | POA: Diagnosis not present

## 2022-02-08 DIAGNOSIS — M25661 Stiffness of right knee, not elsewhere classified: Secondary | ICD-10-CM | POA: Diagnosis not present

## 2022-02-10 DIAGNOSIS — M6281 Muscle weakness (generalized): Secondary | ICD-10-CM | POA: Diagnosis not present

## 2022-02-10 DIAGNOSIS — R2689 Other abnormalities of gait and mobility: Secondary | ICD-10-CM | POA: Diagnosis not present

## 2022-02-10 DIAGNOSIS — M25561 Pain in right knee: Secondary | ICD-10-CM | POA: Diagnosis not present

## 2022-02-10 DIAGNOSIS — M25461 Effusion, right knee: Secondary | ICD-10-CM | POA: Diagnosis not present

## 2022-02-10 DIAGNOSIS — M25661 Stiffness of right knee, not elsewhere classified: Secondary | ICD-10-CM | POA: Diagnosis not present

## 2022-02-15 DIAGNOSIS — R2689 Other abnormalities of gait and mobility: Secondary | ICD-10-CM | POA: Diagnosis not present

## 2022-02-15 DIAGNOSIS — M25461 Effusion, right knee: Secondary | ICD-10-CM | POA: Diagnosis not present

## 2022-02-15 DIAGNOSIS — M25661 Stiffness of right knee, not elsewhere classified: Secondary | ICD-10-CM | POA: Diagnosis not present

## 2022-02-15 DIAGNOSIS — M6281 Muscle weakness (generalized): Secondary | ICD-10-CM | POA: Diagnosis not present

## 2022-02-15 DIAGNOSIS — M25561 Pain in right knee: Secondary | ICD-10-CM | POA: Diagnosis not present

## 2022-02-16 DIAGNOSIS — K5900 Constipation, unspecified: Secondary | ICD-10-CM | POA: Diagnosis not present

## 2022-02-17 DIAGNOSIS — M25461 Effusion, right knee: Secondary | ICD-10-CM | POA: Diagnosis not present

## 2022-02-17 DIAGNOSIS — M6281 Muscle weakness (generalized): Secondary | ICD-10-CM | POA: Diagnosis not present

## 2022-02-17 DIAGNOSIS — M25561 Pain in right knee: Secondary | ICD-10-CM | POA: Diagnosis not present

## 2022-02-17 DIAGNOSIS — R2689 Other abnormalities of gait and mobility: Secondary | ICD-10-CM | POA: Diagnosis not present

## 2022-02-17 DIAGNOSIS — M25661 Stiffness of right knee, not elsewhere classified: Secondary | ICD-10-CM | POA: Diagnosis not present

## 2022-02-21 DIAGNOSIS — M25661 Stiffness of right knee, not elsewhere classified: Secondary | ICD-10-CM | POA: Diagnosis not present

## 2022-02-21 DIAGNOSIS — M25561 Pain in right knee: Secondary | ICD-10-CM | POA: Diagnosis not present

## 2022-02-21 DIAGNOSIS — M6281 Muscle weakness (generalized): Secondary | ICD-10-CM | POA: Diagnosis not present

## 2022-02-21 DIAGNOSIS — R2689 Other abnormalities of gait and mobility: Secondary | ICD-10-CM | POA: Diagnosis not present

## 2022-02-21 DIAGNOSIS — M25461 Effusion, right knee: Secondary | ICD-10-CM | POA: Diagnosis not present

## 2022-02-23 DIAGNOSIS — M25461 Effusion, right knee: Secondary | ICD-10-CM | POA: Diagnosis not present

## 2022-02-23 DIAGNOSIS — R2689 Other abnormalities of gait and mobility: Secondary | ICD-10-CM | POA: Diagnosis not present

## 2022-02-23 DIAGNOSIS — M25561 Pain in right knee: Secondary | ICD-10-CM | POA: Diagnosis not present

## 2022-02-23 DIAGNOSIS — M25661 Stiffness of right knee, not elsewhere classified: Secondary | ICD-10-CM | POA: Diagnosis not present

## 2022-02-23 DIAGNOSIS — M6281 Muscle weakness (generalized): Secondary | ICD-10-CM | POA: Diagnosis not present

## 2022-03-01 DIAGNOSIS — M25461 Effusion, right knee: Secondary | ICD-10-CM | POA: Diagnosis not present

## 2022-03-01 DIAGNOSIS — M25661 Stiffness of right knee, not elsewhere classified: Secondary | ICD-10-CM | POA: Diagnosis not present

## 2022-03-01 DIAGNOSIS — M6281 Muscle weakness (generalized): Secondary | ICD-10-CM | POA: Diagnosis not present

## 2022-03-01 DIAGNOSIS — M25561 Pain in right knee: Secondary | ICD-10-CM | POA: Diagnosis not present

## 2022-03-01 DIAGNOSIS — R2689 Other abnormalities of gait and mobility: Secondary | ICD-10-CM | POA: Diagnosis not present

## 2022-03-03 DIAGNOSIS — M6281 Muscle weakness (generalized): Secondary | ICD-10-CM | POA: Diagnosis not present

## 2022-03-03 DIAGNOSIS — M25661 Stiffness of right knee, not elsewhere classified: Secondary | ICD-10-CM | POA: Diagnosis not present

## 2022-03-03 DIAGNOSIS — M25561 Pain in right knee: Secondary | ICD-10-CM | POA: Diagnosis not present

## 2022-03-03 DIAGNOSIS — K5900 Constipation, unspecified: Secondary | ICD-10-CM | POA: Diagnosis not present

## 2022-03-03 DIAGNOSIS — R2689 Other abnormalities of gait and mobility: Secondary | ICD-10-CM | POA: Diagnosis not present

## 2022-03-03 DIAGNOSIS — M25461 Effusion, right knee: Secondary | ICD-10-CM | POA: Diagnosis not present

## 2022-03-07 DIAGNOSIS — Z96651 Presence of right artificial knee joint: Secondary | ICD-10-CM | POA: Diagnosis not present

## 2022-03-07 DIAGNOSIS — M1711 Unilateral primary osteoarthritis, right knee: Secondary | ICD-10-CM | POA: Diagnosis not present

## 2022-03-10 DIAGNOSIS — M6281 Muscle weakness (generalized): Secondary | ICD-10-CM | POA: Diagnosis not present

## 2022-03-10 DIAGNOSIS — M25461 Effusion, right knee: Secondary | ICD-10-CM | POA: Diagnosis not present

## 2022-03-10 DIAGNOSIS — M25661 Stiffness of right knee, not elsewhere classified: Secondary | ICD-10-CM | POA: Diagnosis not present

## 2022-03-10 DIAGNOSIS — M25561 Pain in right knee: Secondary | ICD-10-CM | POA: Diagnosis not present

## 2022-03-10 DIAGNOSIS — R2689 Other abnormalities of gait and mobility: Secondary | ICD-10-CM | POA: Diagnosis not present

## 2022-03-15 DIAGNOSIS — M25661 Stiffness of right knee, not elsewhere classified: Secondary | ICD-10-CM | POA: Diagnosis not present

## 2022-03-15 DIAGNOSIS — M25561 Pain in right knee: Secondary | ICD-10-CM | POA: Diagnosis not present

## 2022-03-15 DIAGNOSIS — M6281 Muscle weakness (generalized): Secondary | ICD-10-CM | POA: Diagnosis not present

## 2022-03-15 DIAGNOSIS — M25461 Effusion, right knee: Secondary | ICD-10-CM | POA: Diagnosis not present

## 2022-03-15 DIAGNOSIS — R2689 Other abnormalities of gait and mobility: Secondary | ICD-10-CM | POA: Diagnosis not present

## 2022-03-17 DIAGNOSIS — R2689 Other abnormalities of gait and mobility: Secondary | ICD-10-CM | POA: Diagnosis not present

## 2022-03-17 DIAGNOSIS — M25561 Pain in right knee: Secondary | ICD-10-CM | POA: Diagnosis not present

## 2022-03-17 DIAGNOSIS — M25461 Effusion, right knee: Secondary | ICD-10-CM | POA: Diagnosis not present

## 2022-03-17 DIAGNOSIS — M25661 Stiffness of right knee, not elsewhere classified: Secondary | ICD-10-CM | POA: Diagnosis not present

## 2022-03-17 DIAGNOSIS — M6281 Muscle weakness (generalized): Secondary | ICD-10-CM | POA: Diagnosis not present

## 2022-03-22 DIAGNOSIS — M25561 Pain in right knee: Secondary | ICD-10-CM | POA: Diagnosis not present

## 2022-03-22 DIAGNOSIS — M25661 Stiffness of right knee, not elsewhere classified: Secondary | ICD-10-CM | POA: Diagnosis not present

## 2022-03-22 DIAGNOSIS — M25461 Effusion, right knee: Secondary | ICD-10-CM | POA: Diagnosis not present

## 2022-03-22 DIAGNOSIS — R2689 Other abnormalities of gait and mobility: Secondary | ICD-10-CM | POA: Diagnosis not present

## 2022-03-22 DIAGNOSIS — M6281 Muscle weakness (generalized): Secondary | ICD-10-CM | POA: Diagnosis not present

## 2022-03-24 DIAGNOSIS — M6281 Muscle weakness (generalized): Secondary | ICD-10-CM | POA: Diagnosis not present

## 2022-03-24 DIAGNOSIS — M25461 Effusion, right knee: Secondary | ICD-10-CM | POA: Diagnosis not present

## 2022-03-24 DIAGNOSIS — M25661 Stiffness of right knee, not elsewhere classified: Secondary | ICD-10-CM | POA: Diagnosis not present

## 2022-03-24 DIAGNOSIS — R2689 Other abnormalities of gait and mobility: Secondary | ICD-10-CM | POA: Diagnosis not present

## 2022-03-24 DIAGNOSIS — M25561 Pain in right knee: Secondary | ICD-10-CM | POA: Diagnosis not present

## 2022-03-28 DIAGNOSIS — M25661 Stiffness of right knee, not elsewhere classified: Secondary | ICD-10-CM | POA: Diagnosis not present

## 2022-03-28 DIAGNOSIS — R2689 Other abnormalities of gait and mobility: Secondary | ICD-10-CM | POA: Diagnosis not present

## 2022-03-28 DIAGNOSIS — M6281 Muscle weakness (generalized): Secondary | ICD-10-CM | POA: Diagnosis not present

## 2022-03-28 DIAGNOSIS — M25461 Effusion, right knee: Secondary | ICD-10-CM | POA: Diagnosis not present

## 2022-03-28 DIAGNOSIS — M25561 Pain in right knee: Secondary | ICD-10-CM | POA: Diagnosis not present

## 2022-04-06 DIAGNOSIS — M6281 Muscle weakness (generalized): Secondary | ICD-10-CM | POA: Diagnosis not present

## 2022-04-06 DIAGNOSIS — M25461 Effusion, right knee: Secondary | ICD-10-CM | POA: Diagnosis not present

## 2022-04-06 DIAGNOSIS — R2689 Other abnormalities of gait and mobility: Secondary | ICD-10-CM | POA: Diagnosis not present

## 2022-04-06 DIAGNOSIS — M25561 Pain in right knee: Secondary | ICD-10-CM | POA: Diagnosis not present

## 2022-04-06 DIAGNOSIS — M25661 Stiffness of right knee, not elsewhere classified: Secondary | ICD-10-CM | POA: Diagnosis not present

## 2022-04-13 DIAGNOSIS — M25461 Effusion, right knee: Secondary | ICD-10-CM | POA: Diagnosis not present

## 2022-04-13 DIAGNOSIS — M7061 Trochanteric bursitis, right hip: Secondary | ICD-10-CM | POA: Diagnosis not present

## 2022-04-13 DIAGNOSIS — M6281 Muscle weakness (generalized): Secondary | ICD-10-CM | POA: Diagnosis not present

## 2022-04-13 DIAGNOSIS — M545 Low back pain, unspecified: Secondary | ICD-10-CM | POA: Diagnosis not present

## 2022-04-13 DIAGNOSIS — M25661 Stiffness of right knee, not elsewhere classified: Secondary | ICD-10-CM | POA: Diagnosis not present

## 2022-04-13 DIAGNOSIS — M25561 Pain in right knee: Secondary | ICD-10-CM | POA: Diagnosis not present

## 2022-04-13 DIAGNOSIS — G8929 Other chronic pain: Secondary | ICD-10-CM | POA: Diagnosis not present

## 2022-04-13 DIAGNOSIS — R2689 Other abnormalities of gait and mobility: Secondary | ICD-10-CM | POA: Diagnosis not present

## 2022-05-08 DIAGNOSIS — I129 Hypertensive chronic kidney disease with stage 1 through stage 4 chronic kidney disease, or unspecified chronic kidney disease: Secondary | ICD-10-CM | POA: Diagnosis not present

## 2022-05-08 DIAGNOSIS — K635 Polyp of colon: Secondary | ICD-10-CM | POA: Diagnosis not present

## 2022-05-08 DIAGNOSIS — N1831 Chronic kidney disease, stage 3a: Secondary | ICD-10-CM | POA: Diagnosis not present

## 2022-05-08 DIAGNOSIS — E310 Autoimmune polyglandular failure: Secondary | ICD-10-CM | POA: Diagnosis not present

## 2022-05-08 DIAGNOSIS — Z79899 Other long term (current) drug therapy: Secondary | ICD-10-CM | POA: Diagnosis not present

## 2022-05-08 DIAGNOSIS — K219 Gastro-esophageal reflux disease without esophagitis: Secondary | ICD-10-CM | POA: Diagnosis not present

## 2022-05-08 DIAGNOSIS — E039 Hypothyroidism, unspecified: Secondary | ICD-10-CM | POA: Diagnosis not present

## 2022-05-08 DIAGNOSIS — E785 Hyperlipidemia, unspecified: Secondary | ICD-10-CM | POA: Diagnosis not present

## 2022-05-08 DIAGNOSIS — D751 Secondary polycythemia: Secondary | ICD-10-CM | POA: Diagnosis not present

## 2022-05-18 ENCOUNTER — Encounter: Payer: Self-pay | Admitting: Podiatry

## 2022-05-18 ENCOUNTER — Ambulatory Visit: Payer: Medicare HMO | Admitting: Podiatry

## 2022-05-18 DIAGNOSIS — L600 Ingrowing nail: Secondary | ICD-10-CM | POA: Diagnosis not present

## 2022-05-18 DIAGNOSIS — B351 Tinea unguium: Secondary | ICD-10-CM

## 2022-05-18 DIAGNOSIS — I739 Peripheral vascular disease, unspecified: Secondary | ICD-10-CM

## 2022-05-18 NOTE — Patient Instructions (Signed)
Call and schedule with Dr. Blenda Mounts or Dr. Valentina Lucks if ingrowns start to become a problem again.  EPSOM SALT FOOT SOAK INSTRUCTIONS  *IF YOU HAVE BEEN PRESCRIBED ANTIBIOTICS, TAKE AS INSTRUCTED UNTIL ALL ARE GONE*  Shopping List:  A. Plain epsom salt (not scented) B. Neosporin Cream C. 1-inch fabric band-aids   Place 1/4 cup of epsom salts in 2 quarts of warm tap water. IF YOU ARE DIABETIC, OR HAVE NEUROPATHY, CHECK THE TEMPERATURE OF THE WATER WITH YOUR ELBOW.   Submerge your foot/feet in the solution and soak for 10-15 minutes.      3.  Next, remove your foot/feet from solution, blot dry the affected area.    4.  Apply light amount of antibiotic cream/ointment and cover with fabric band-aid .  5.  This soak should be done once a day for 3-5 days.   6.  Monitor for any signs/symptoms of infection such as redness, swelling, odor, drainage, increased pain, or non-healing of digit.   7.  Please do not hesitate to call the office and speak to a Nurse or Doctor if you have questions.   8.  If you experience fever, chills, nightsweats, nausea or vomiting with worsening of digit/foot, please go to the emergency room.    Ingrown Toenail  An ingrown toenail occurs when the corner or sides of a toenail grow into the surrounding skin. This causes discomfort and pain. The big toe is most commonly affected, but any of the toes can be affected. If an ingrown toenail is not treated, it can become infected. What are the causes? This condition may be caused by: Wearing shoes that are too small or tight. An injury, such as stubbing your toe or having your toe stepped on. Improper cutting or care of your toenails. Having nail or foot abnormalities that were present from birth (congenital abnormalities), such as having a nail that is too big for your toe. What increases the risk? The following factors may make you more likely to develop ingrown toenails: Age. Nails tend to get thicker with age, so  ingrown nails are more common among older people. Cutting your toenails incorrectly, such as cutting them very short or cutting them unevenly. An ingrown toenail is more likely to get infected if you have: Diabetes. Blood flow (circulation) problems. What are the signs or symptoms? Symptoms of an ingrown toenail may include: Pain, soreness, or tenderness. Redness. Swelling. Hardening of the skin that surrounds the toenail. Signs that an ingrown toenail may be infected include: Fluid or pus. Symptoms that get worse. How is this diagnosed? Ingrown toenails may be diagnosed based on: Your symptoms and medical history. A physical exam. Labs or tests. If you have fluid or blood coming from your toenail, a sample may be collected to test for the specific type of bacteria that is causing the infection. How is this treated? Treatment depends on the severity of your symptoms. You may be able to care for your toenail at home. If you have an infection, you may be prescribed antibiotic medicines. If you have fluid or pus draining from your toenail, your health care provider may drain it. If you have trouble walking, you may be given crutches to use. If you have a severe or infected ingrown toenail, you may need a procedure to remove part or all of the nail. Follow these instructions at home: Orient your wound every day for signs of infection, or as often as told by your health care  provider. Check for: More redness, swelling, or pain. More fluid or blood. Warmth. Pus or a bad smell. Do not pick at your toenail or try to remove it yourself. Soak your foot in warm, soapy water. Do this for 20 minutes, 3 times a day, or as often as told by your health care provider. This helps to keep your toe clean and your skin soft. Wear shoes that fit well and are not too tight. Your health care provider may recommend that you wear open-toed shoes while you heal. Trim your toenails regularly and  carefully. Cut your toenails straight across to prevent injury to the skin at the corners of the toenail. Do not cut your nails in a curved shape. Keep your feet clean and dry to help prevent infection. General instructions Take over-the-counter and prescription medicines only as told by your health care provider. If you were prescribed an antibiotic, take it as told by your health care provider. Do not stop taking the antibiotic even if you start to feel better. If your health care provider told you to use crutches to help you move around, use them as instructed. Return to your normal activities as told by your health care provider. Ask your health care provider what activities are safe for you. Keep all follow-up visits. This is important. Contact a health care provider if: You have more redness, swelling, pain, or other symptoms that do not improve with treatment. You have fluid, blood, or pus coming from your toenail. You have a red streak on your skin that starts at your foot and spreads up your leg. You have a fever. Summary An ingrown toenail occurs when the corner or sides of a toenail grow into the surrounding skin. This causes discomfort and pain. The big toe is most commonly affected, but any of the toes can be affected. If an ingrown toenail is not treated, it can become infected. Fluid or pus draining from your toenail is a sign of infection. Your health care provider may need to drain it. You may be given antibiotics to treat the infection. Trimming your toenails regularly and properly can help you prevent an ingrown toenail. This information is not intended to replace advice given to you by your health care provider. Make sure you discuss any questions you have with your health care provider. Document Revised: 03/08/2021 Document Reviewed: 03/08/2021 Elsevier Patient Education  [place].

## 2022-05-28 NOTE — Progress Notes (Signed)
  Subjective:  Patient ID: Sarah Atkins, female    DOB: January 22, 1956,  MRN: [id number]  Lennan Malone Carbonneau presents to clinic today for for at risk foot care. Patient has h/o PAD, painful, discolored, thick toenails which interfere with daily activities, and ingrown toenail to the bilateral great toes  Patient states big toes are ingrown and tender when wearing enclosed shoe gear. Denies any drainage or swelling.  PCP is [place], Annie Main, MD , and last visit was last week.  No Known Allergies  Review of Systems: Negative except as noted in the HPI.  Objective: No changes noted in today's physical examination. Vascular Examination: CFT <4 seconds b/l. DP pulses faintly palpable b/l. PT pulses diminished b/l. Digital hair absent. Skin temperature gradient warm to warm b/l. No ischemia or gangrene. No cyanosis or clubbing noted b/l. No edema noted b/l LE.   Neurological Examination: Sensation grossly intact b/l with 10 gram monofilament. Vibratory sensation intact b/l.   Dermatological Examination: Pedal integument with normal turgor, texture and tone BLE. Toenails 1-5 b/l elongated, discolored, dystrophic, thickened, crumbly with subungual debris and tenderness to dorsal palpation. Incurvated nailplate both borders of left hallux and both borders of right hallux.  Nail border hypertrophy absent. There is tenderness to palpation. Sign(s) of infection: no clinical signs of infection noted on examination today.. No hyperkeratotic nor porokeratotic lesions present on today's visit.  Musculoskeletal Examination: Muscle strength 5/5 to b/l LE. Hammertoe deformity noted 2-5 b/l.  Radiographs: None  Assessment/Plan: 1. Pain due to onychomycosis of toenails of both feet   2. Ingrown toenail without infection   3. PVD (peripheral vascular disease) ([place])     -Patient was evaluated and treated. All patient's and/or POA's questions/concerns answered on today's visit. -Patient to continue  soft, supportive shoe gear daily. -Toenails 1-5 b/l were debrided in length and girth with sterile nail nippers and dremel without iatrogenic bleeding.  -Discussed chronicity of ingrown toenail(s) of bilateral great toes. Recommended patient consider having matrixectomy performed to alleviate chronic ingrown toenail(s). Discussed in-office procedure and post-procedure instructions. Patient agrees and will be referred to Dr. Valentina Lucks or Dr. Blenda Mounts for procedure. -Offending nail border debrided and curretaged both borders of left hallux and both borders of right hallux utilizing sterile nail nipper and currette. Border(s) cleansed with alcohol and triple antibiotic ointment applied. Dispensed written instructions for once daily epsom salt soaks for 3-5 days. Call office if there are any concerns. -Patient/POA to call should there be question/concern in the interim.   Return in about 3 months (around 08/18/2022).  Marzetta Board, DPM

## 2022-06-06 DIAGNOSIS — Z96651 Presence of right artificial knee joint: Secondary | ICD-10-CM | POA: Diagnosis not present

## 2022-06-13 ENCOUNTER — Ambulatory Visit: Payer: Medicare HMO | Admitting: Podiatrist

## 2022-06-13 DIAGNOSIS — L600 Ingrowing nail: Secondary | ICD-10-CM | POA: Diagnosis not present

## 2022-06-13 NOTE — Progress Notes (Signed)
Chief Complaint  Patient presents with   Ingrown Toenail    Bilateral ingrown right lateral hallux and Left bilateral hallux x 6 months intermittent.      HPI: Patient is 66 y.o. female who presents today for ingrown nail right first lateral side - she has had the nails trimmed in the past with some relief in symptoms. She relates the nail has become sore and tender and she has pain especially on the lateral side of the right hallux nail.  Denies any pus or drainage to the toe.   Patient Active Problem List   Diagnosis Date Noted   Chronic kidney disease, stage 3a (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     Cholecalciferol (VITAMIN D3) 50 MCG (2000 UT) TABS Take 4 tablets by mouth daily.     cyclobenzaprine (FLEXERIL) 5 MG tablet Take 5 mg by mouth every 8 (eight) hours as needed.     docusate sodium (COLACE) 100 MG capsule Take 100 mg by mouth 2 (two) times daily as needed.     gabapentin (NEURONTIN) 300 MG capsule Take 300 mg by mouth 3 (three) times daily.     GOODSENSE PAIN RELIEF 325 MG tablet Take 325 mg by mouth 2 (two) times daily.     GOODSENSE STIMULANT LAXATIVE 8.6-50 MG tablet Take 2 tablets by mouth at bedtime.     hydrochlorothiazide (HYDRODIURIL) 25 MG tablet Take 25 mg by mouth daily.     levothyroxine (SYNTHROID, LEVOTHROID) 175 MCG tablet Take 175 mcg by  mouth daily before breakfast.     LINZESS 145 MCG CAPS capsule Take 145 mcg by mouth daily.     meloxicam (MOBIC) 15 MG tablet Take 15 mg by mouth daily.     Multiple Vitamin (MULTIVITAMIN) tablet Take 1 tablet by mouth daily.     oxybutynin (DITROPAN) 5 MG tablet Take 1 tablet by mouth daily.     Oyster Shell Calcium 500 MG TABS Take 1 tablet by mouth 2 (two) times daily.     No current facility-administered medications on file prior to visit.    No Known Allergies  Review of Systems No fevers, chills, nausea, muscle aches, no difficulty breathing, no calf pain, no chest pain or shortness of breath.   Physical Exam  GENERAL APPEARANCE: Alert, conversant. Appropriately groomed. No acute distress.   VASCULAR: Pedal pulses palpable 2/4 DP and 2/4 PT bilateral.  Capillary refill time is immediate to all digits,  Proximal to distal cooling it warm to warm.  Digital perfusion adequate.   NEUROLOGIC: sensation is intact to 5.07 monofilament at 5/5 sites bilateral.  Light touch is intact bilateral, vibratory sensation intact bilateral  MUSCULOSKELETAL: acceptable muscle strength, tone and stability bilateral.  No gross boney pedal deformities noted.  No pain, crepitus or limitation noted with foot and ankle range of motion bilateral.   DERMATOLOGIC: skin is warm, supple, and dry.  Color, texture, and turgor of skin  within normal limits.    Right hallux lateral nail border is painful and symptomatic with pressure.  Some redness noted. No drainage, no pus, irritation on the side is noted.  Left hallux is also irritated but not as significant as the right   Assessment     ICD-10-CM   1. Ingrown nail of great toe of right foot  L60.0        Plan  Treatment options and alternatives were discussed. Recommended a permanent removal of the lataral nail border of the right great toenail.  Patient agreed. Skin was prepped with alcohol and a local injection of lidocaine and Marcaine plain was  infiltrated to anesthetize the toe. The toe was then prepped with Betadine and exsanguinated. The offending nail border was removed and phenol applied to the exposed matrix tissue.  The area was then cleansed well with alcohol.  Antibiotic ointment and a dressing was then applied the tourniquet released  noting a prompt hyperemic response to the tip of the toe.  Oral and written instructions were dispensed and the patient was instructed on aftercare.  If there is any increased redness, swelling, drainage, pus or any other concerns arise, [place] will call to be seen.

## 2022-06-13 NOTE — Patient Instructions (Signed)

## 2022-06-14 DIAGNOSIS — R2689 Other abnormalities of gait and mobility: Secondary | ICD-10-CM | POA: Diagnosis not present

## 2022-06-14 DIAGNOSIS — M25561 Pain in right knee: Secondary | ICD-10-CM | POA: Diagnosis not present

## 2022-06-14 DIAGNOSIS — M545 Low back pain, unspecified: Secondary | ICD-10-CM | POA: Diagnosis not present

## 2022-06-14 DIAGNOSIS — M62551 Muscle wasting and atrophy, not elsewhere classified, right thigh: Secondary | ICD-10-CM | POA: Diagnosis not present

## 2022-06-16 DIAGNOSIS — M25561 Pain in right knee: Secondary | ICD-10-CM | POA: Diagnosis not present

## 2022-06-16 DIAGNOSIS — R2689 Other abnormalities of gait and mobility: Secondary | ICD-10-CM | POA: Diagnosis not present

## 2022-06-16 DIAGNOSIS — M545 Low back pain, unspecified: Secondary | ICD-10-CM | POA: Diagnosis not present

## 2022-06-16 DIAGNOSIS — M62551 Muscle wasting and atrophy, not elsewhere classified, right thigh: Secondary | ICD-10-CM | POA: Diagnosis not present

## 2022-06-21 DIAGNOSIS — M545 Low back pain, unspecified: Secondary | ICD-10-CM | POA: Diagnosis not present

## 2022-06-21 DIAGNOSIS — R2689 Other abnormalities of gait and mobility: Secondary | ICD-10-CM | POA: Diagnosis not present

## 2022-06-21 DIAGNOSIS — M62551 Muscle wasting and atrophy, not elsewhere classified, right thigh: Secondary | ICD-10-CM | POA: Diagnosis not present

## 2022-06-21 DIAGNOSIS — M25561 Pain in right knee: Secondary | ICD-10-CM | POA: Diagnosis not present

## 2022-06-26 DIAGNOSIS — R2689 Other abnormalities of gait and mobility: Secondary | ICD-10-CM | POA: Diagnosis not present

## 2022-06-26 DIAGNOSIS — M545 Low back pain, unspecified: Secondary | ICD-10-CM | POA: Diagnosis not present

## 2022-06-26 DIAGNOSIS — M62551 Muscle wasting and atrophy, not elsewhere classified, right thigh: Secondary | ICD-10-CM | POA: Diagnosis not present

## 2022-06-26 DIAGNOSIS — M25561 Pain in right knee: Secondary | ICD-10-CM | POA: Diagnosis not present

## 2022-06-27 ENCOUNTER — Encounter: Payer: Self-pay | Admitting: Podiatrist

## 2022-06-28 DIAGNOSIS — M545 Low back pain, unspecified: Secondary | ICD-10-CM | POA: Diagnosis not present

## 2022-06-28 DIAGNOSIS — M25561 Pain in right knee: Secondary | ICD-10-CM | POA: Diagnosis not present

## 2022-06-28 DIAGNOSIS — M62551 Muscle wasting and atrophy, not elsewhere classified, right thigh: Secondary | ICD-10-CM | POA: Diagnosis not present

## 2022-06-28 DIAGNOSIS — R2689 Other abnormalities of gait and mobility: Secondary | ICD-10-CM | POA: Diagnosis not present

## 2022-07-03 DIAGNOSIS — M545 Low back pain, unspecified: Secondary | ICD-10-CM | POA: Diagnosis not present

## 2022-07-03 DIAGNOSIS — M62551 Muscle wasting and atrophy, not elsewhere classified, right thigh: Secondary | ICD-10-CM | POA: Diagnosis not present

## 2022-07-03 DIAGNOSIS — M25561 Pain in right knee: Secondary | ICD-10-CM | POA: Diagnosis not present

## 2022-07-03 DIAGNOSIS — R2689 Other abnormalities of gait and mobility: Secondary | ICD-10-CM | POA: Diagnosis not present

## 2022-07-05 DIAGNOSIS — M25561 Pain in right knee: Secondary | ICD-10-CM | POA: Diagnosis not present

## 2022-07-05 DIAGNOSIS — M62551 Muscle wasting and atrophy, not elsewhere classified, right thigh: Secondary | ICD-10-CM | POA: Diagnosis not present

## 2022-07-05 DIAGNOSIS — M545 Low back pain, unspecified: Secondary | ICD-10-CM | POA: Diagnosis not present

## 2022-07-05 DIAGNOSIS — R2689 Other abnormalities of gait and mobility: Secondary | ICD-10-CM | POA: Diagnosis not present

## 2022-07-11 DIAGNOSIS — R2689 Other abnormalities of gait and mobility: Secondary | ICD-10-CM | POA: Diagnosis not present

## 2022-07-11 DIAGNOSIS — M25561 Pain in right knee: Secondary | ICD-10-CM | POA: Diagnosis not present

## 2022-07-11 DIAGNOSIS — M62551 Muscle wasting and atrophy, not elsewhere classified, right thigh: Secondary | ICD-10-CM | POA: Diagnosis not present

## 2022-07-11 DIAGNOSIS — M545 Low back pain, unspecified: Secondary | ICD-10-CM | POA: Diagnosis not present

## 2022-07-13 DIAGNOSIS — M62551 Muscle wasting and atrophy, not elsewhere classified, right thigh: Secondary | ICD-10-CM | POA: Diagnosis not present

## 2022-07-13 DIAGNOSIS — M25561 Pain in right knee: Secondary | ICD-10-CM | POA: Diagnosis not present

## 2022-07-13 DIAGNOSIS — R2689 Other abnormalities of gait and mobility: Secondary | ICD-10-CM | POA: Diagnosis not present

## 2022-07-13 DIAGNOSIS — M545 Low back pain, unspecified: Secondary | ICD-10-CM | POA: Diagnosis not present

## 2022-07-18 DIAGNOSIS — M62551 Muscle wasting and atrophy, not elsewhere classified, right thigh: Secondary | ICD-10-CM | POA: Diagnosis not present

## 2022-07-18 DIAGNOSIS — M25561 Pain in right knee: Secondary | ICD-10-CM | POA: Diagnosis not present

## 2022-07-18 DIAGNOSIS — R2689 Other abnormalities of gait and mobility: Secondary | ICD-10-CM | POA: Diagnosis not present

## 2022-07-18 DIAGNOSIS — M545 Low back pain, unspecified: Secondary | ICD-10-CM | POA: Diagnosis not present

## 2022-07-20 DIAGNOSIS — M25561 Pain in right knee: Secondary | ICD-10-CM | POA: Diagnosis not present

## 2022-07-20 DIAGNOSIS — R2689 Other abnormalities of gait and mobility: Secondary | ICD-10-CM | POA: Diagnosis not present

## 2022-07-20 DIAGNOSIS — M62551 Muscle wasting and atrophy, not elsewhere classified, right thigh: Secondary | ICD-10-CM | POA: Diagnosis not present

## 2022-07-20 DIAGNOSIS — M545 Low back pain, unspecified: Secondary | ICD-10-CM | POA: Diagnosis not present

## 2022-08-01 DIAGNOSIS — M25561 Pain in right knee: Secondary | ICD-10-CM | POA: Diagnosis not present

## 2022-08-01 DIAGNOSIS — M62551 Muscle wasting and atrophy, not elsewhere classified, right thigh: Secondary | ICD-10-CM | POA: Diagnosis not present

## 2022-08-01 DIAGNOSIS — M545 Low back pain, unspecified: Secondary | ICD-10-CM | POA: Diagnosis not present

## 2022-08-01 DIAGNOSIS — R2689 Other abnormalities of gait and mobility: Secondary | ICD-10-CM | POA: Diagnosis not present

## 2022-08-24 ENCOUNTER — Encounter: Payer: Self-pay | Admitting: Podiatry

## 2022-08-24 ENCOUNTER — Ambulatory Visit: Payer: Medicare HMO | Admitting: Podiatry

## 2022-08-24 DIAGNOSIS — M79674 Pain in right toe(s): Secondary | ICD-10-CM | POA: Diagnosis not present

## 2022-08-24 DIAGNOSIS — Q828 Other specified congenital malformations of skin: Secondary | ICD-10-CM

## 2022-08-24 DIAGNOSIS — B351 Tinea unguium: Secondary | ICD-10-CM | POA: Diagnosis not present

## 2022-08-24 DIAGNOSIS — M79675 Pain in left toe(s): Secondary | ICD-10-CM | POA: Diagnosis not present

## 2022-08-24 DIAGNOSIS — I739 Peripheral vascular disease, unspecified: Secondary | ICD-10-CM | POA: Diagnosis not present

## 2022-08-25 DIAGNOSIS — Z1231 Encounter for screening mammogram for malignant neoplasm of breast: Secondary | ICD-10-CM | POA: Diagnosis not present

## 2022-08-27 NOTE — Progress Notes (Signed)
  Subjective:  Patient ID: Sarah Atkins, female    DOB: 07-Sep-1956,  MRN: [id number]  Sarah Atkins presents to clinic today for:  Chief Complaint  Patient presents with   Nail Problem    Routine foot care PCP-Sarah Atkins PCP VST-Sarah Atkins or July 2023    New problem(s): None.   PCP is Sarah Bowen, MD , and last visit was  Sarah Atkins 19, 2023.  No Known Allergies  Review of Systems: Negative except as noted in the HPI.  Objective:   Sarah Atkins is a pleasant 66 y.o. female in NAD. AAO x 3. Vascular Examination: CFT <4 seconds b/l. DP pulses faintly palpable b/l. PT pulses diminished b/l. Digital hair absent. Skin temperature gradient warm to warm b/l. No ischemia or gangrene. No cyanosis or clubbing noted b/l. No edema noted b/l LE.   Neurological Examination: Sensation grossly intact b/l with 10 gram monofilament. Vibratory sensation intact b/l.   Dermatological Examination: Pedal integument with normal turgor, texture and tone BLE. Toenails 1-5 b/l elongated, discolored, dystrophic, thickened, crumbly with subungual debris and tenderness to dorsal palpation.   Incurvated nailplate both borders of left hallux and both borders of right hallux.  Nail border hypertrophy absent. There is tenderness to palpation. Sign(s) of infection: no clinical signs of infection noted on examination today.  Porokeratotic lesion(s) 5th met base left lower extremity. No erythema, no edema, no drainage, no fluctuance.    Musculoskeletal Examination: Muscle strength 5/5 to b/l LE. Hammertoe deformity noted 2-5 b/l. Assessment/Plan: 1. Pain due to onychomycosis of toenails of both feet   2. Porokeratosis   3. PVD (peripheral vascular disease) ([place])     No orders of the defined types were placed in this encounter.   -Consent given for treatment as described below: -Examined patient. -Mycotic toenails 1-5 bilaterally were debrided in length and girth with sterile nail  nippers and dremel without incident. -Porokeratotic lesion(s) 5th met base left lower extremity pared and enucleated with sterile currette without incident. Total number of lesions debrided=1. -Patient/POA to call should there be question/concern in the interim.   Return in about 3 months (around 11/24/2022).  Marzetta Board, DPM

## 2022-08-29 DIAGNOSIS — Z800 Family history of malignant neoplasm of digestive organs: Secondary | ICD-10-CM | POA: Diagnosis not present

## 2022-10-05 DIAGNOSIS — Z800 Family history of malignant neoplasm of digestive organs: Secondary | ICD-10-CM | POA: Diagnosis not present

## 2022-10-05 DIAGNOSIS — D122 Benign neoplasm of ascending colon: Secondary | ICD-10-CM | POA: Diagnosis not present

## 2022-10-05 DIAGNOSIS — K635 Polyp of colon: Secondary | ICD-10-CM | POA: Diagnosis not present

## 2022-10-05 DIAGNOSIS — Z1211 Encounter for screening for malignant neoplasm of colon: Secondary | ICD-10-CM | POA: Diagnosis not present

## 2022-10-05 DIAGNOSIS — D126 Benign neoplasm of colon, unspecified: Secondary | ICD-10-CM | POA: Diagnosis not present

## 2022-11-24 DIAGNOSIS — R7989 Other specified abnormal findings of blood chemistry: Secondary | ICD-10-CM | POA: Diagnosis not present

## 2022-11-24 DIAGNOSIS — E039 Hypothyroidism, unspecified: Secondary | ICD-10-CM | POA: Diagnosis not present

## 2022-11-24 DIAGNOSIS — E785 Hyperlipidemia, unspecified: Secondary | ICD-10-CM | POA: Diagnosis not present

## 2022-11-24 DIAGNOSIS — I10 Essential (primary) hypertension: Secondary | ICD-10-CM | POA: Diagnosis not present

## 2022-11-27 DIAGNOSIS — E039 Hypothyroidism, unspecified: Secondary | ICD-10-CM | POA: Diagnosis not present

## 2022-11-30 ENCOUNTER — Ambulatory Visit: Payer: Medicare HMO | Admitting: Podiatry

## 2022-11-30 ENCOUNTER — Encounter: Payer: Self-pay | Admitting: Podiatry

## 2022-11-30 VITALS — BP 132/84

## 2022-11-30 DIAGNOSIS — I739 Peripheral vascular disease, unspecified: Secondary | ICD-10-CM

## 2022-11-30 DIAGNOSIS — L600 Ingrowing nail: Secondary | ICD-10-CM | POA: Diagnosis not present

## 2022-11-30 DIAGNOSIS — M79675 Pain in left toe(s): Secondary | ICD-10-CM | POA: Diagnosis not present

## 2022-11-30 DIAGNOSIS — B351 Tinea unguium: Secondary | ICD-10-CM

## 2022-11-30 DIAGNOSIS — M79674 Pain in right toe(s): Secondary | ICD-10-CM | POA: Diagnosis not present

## 2022-11-30 NOTE — Patient Instructions (Signed)
EPSOM SALT FOOT SOAK INSTRUCTIONS  *IF YOU HAVE BEEN PRESCRIBED ANTIBIOTICS, TAKE AS INSTRUCTED UNTIL ALL ARE GONE*  Shopping List:  A. Plain epsom salt (not scented) B. Neosporin Cream/Ointment or Bacitracin Cream/Ointment (or prescribed antiobiotic drops/cream/ointment) C. 1-inch fabric band-aids   Place 1/4 cup of epsom salts in 2 quarts of warm tap water. IF YOU ARE DIABETIC, OR HAVE NEUROPATHY, CHECK THE TEMPERATURE OF THE WATER WITH YOUR ELBOW.   Submerge your foot/feet in the solution and soak for 10-15 minutes.      3.  Next, remove your foot/feet from solution, blot dry the affected area.    4.  Apply light amount of antibiotic cream/ointment and cover with fabric band-aid .  5.  This soak should be done once a day for 7 days.   6.  Monitor for any signs/symptoms of infection such as redness, swelling, odor, drainage, increased pain, or non-healing of digit.   7.  Please do not hesitate to call the office and speak to a Nurse or Doctor if you have questions.   8.  If you experience fever, chills, nightsweats, nausea or vomiting with worsening of digit/foot, please go to the emergency room.

## 2022-11-30 NOTE — Progress Notes (Signed)
  Subjective:  Patient ID: Sarah Atkins, female    DOB: January 12, 1956,  MRN: [id number]  Sarah Atkins presents to clinic today for painful thick toenails that are difficult to trim. Pain interferes with ambulation. Aggravating factors include wearing enclosed shoe gear. Pain is relieved with periodic professional debridement.  Chief Complaint  Patient presents with   Ingrown Toenail    Patient relates tenderness to both great toes lateral border. She presented as walk in on today. She is unable to tolerate an enclosed shoe. Denies any redness, drainage or swelling.   New problem(s): with chief concern of ingrown toenail(s) to lateral border of bilateral great toes. Onset was several days ago.  Pain is described as tender.  Previous treatment(s) include previous podiatry visit.   PCP is [place], Annie Main, MD.  No Known Allergies  Review of Systems: Negative except as noted in the HPI.  Objective: No changes noted in today's physical examination. Vitals:   11/30/22 1332  BP: 132/84    Sarah Atkins is a pleasant 67 y.o. female WD, WN in NAD. AAO x 3. Vascular Examination: CFT <4 seconds b/l. DP pulses faintly palpable b/l. PT pulses diminished b/l. Digital hair absent. Skin temperature gradient warm to warm b/l. No ischemia or gangrene. No cyanosis or clubbing noted b/l. No edema noted b/l LE.   Neurological Examination: Sensation grossly intact b/l with 10 gram monofilament. Vibratory sensation intact b/l.   Dermatological Examination: Pedal integument with normal turgor, texture and tone BLE. Toenails 1-5 b/l elongated, discolored, dystrophic, thickened, crumbly with subungual debris and tenderness to dorsal palpation.   Incurvated nailplate both borders of left hallux and both borders of right hallux.  Nail border hypertrophy absent. There is tenderness to palpation. Sign(s) of infection: no clinical signs of infection noted on examination  today.  Porokeratotic lesion(s) 5th met base left lower extremity. No erythema, no edema, no drainage, no fluctuance.  Assessment/Plan: 1. Pain due to onychomycosis of toenails of both feet   2. Ingrown toenail without infection   3. PVD (peripheral vascular disease) ([place])     No orders of the defined types were placed in this encounter.  -Patient was evaluated and treated. All patient's and/or POA's questions/concerns answered on today's visit. -Continue supportive shoe gear daily. -Toenails 2-5 bilaterally debrided in length and girth without iatrogenic bleeding with sterile nail nipper and dremel.  -No invasive procedure(s) performed. Offending nail border debrided and curretaged bilateral great toes utilizing sterile nail nipper and currette. Border(s) cleansed with alcohol and triple antibiotic ointment applied. Dispensed written instructions for once daily epsom salt soaks for 7 days. Call office if there are any concerns. -If she remains symptomatic, she may schedule an appointment with Dr. Loel Lofty. She related understanding. -Patient/POA to call should there be question/concern in the interim.   Return in about 3 months (around 03/01/2023).  Marzetta Board, DPM

## 2022-12-01 DIAGNOSIS — E039 Hypothyroidism, unspecified: Secondary | ICD-10-CM | POA: Diagnosis not present

## 2022-12-01 DIAGNOSIS — Z1339 Encounter for screening examination for other mental health and behavioral disorders: Secondary | ICD-10-CM | POA: Diagnosis not present

## 2022-12-01 DIAGNOSIS — R82998 Other abnormal findings in urine: Secondary | ICD-10-CM | POA: Diagnosis not present

## 2022-12-01 DIAGNOSIS — I8390 Asymptomatic varicose veins of unspecified lower extremity: Secondary | ICD-10-CM | POA: Diagnosis not present

## 2022-12-01 DIAGNOSIS — E785 Hyperlipidemia, unspecified: Secondary | ICD-10-CM | POA: Diagnosis not present

## 2022-12-01 DIAGNOSIS — K219 Gastro-esophageal reflux disease without esophagitis: Secondary | ICD-10-CM | POA: Diagnosis not present

## 2022-12-01 DIAGNOSIS — I10 Essential (primary) hypertension: Secondary | ICD-10-CM | POA: Diagnosis not present

## 2022-12-01 DIAGNOSIS — E310 Autoimmune polyglandular failure: Secondary | ICD-10-CM | POA: Diagnosis not present

## 2022-12-01 DIAGNOSIS — Z1331 Encounter for screening for depression: Secondary | ICD-10-CM | POA: Diagnosis not present

## 2022-12-01 DIAGNOSIS — Z0000 Encounter for general adult medical examination without abnormal findings: Secondary | ICD-10-CM | POA: Diagnosis not present

## 2022-12-01 DIAGNOSIS — K635 Polyp of colon: Secondary | ICD-10-CM | POA: Diagnosis not present

## 2022-12-07 ENCOUNTER — Ambulatory Visit: Payer: Medicare HMO | Admitting: Podiatry

## 2022-12-23 DIAGNOSIS — M25511 Pain in right shoulder: Secondary | ICD-10-CM | POA: Diagnosis not present

## 2022-12-25 DIAGNOSIS — G8929 Other chronic pain: Secondary | ICD-10-CM | POA: Diagnosis not present

## 2022-12-25 DIAGNOSIS — M25511 Pain in right shoulder: Secondary | ICD-10-CM | POA: Diagnosis not present

## 2023-01-02 DIAGNOSIS — M25511 Pain in right shoulder: Secondary | ICD-10-CM | POA: Diagnosis not present

## 2023-01-04 DIAGNOSIS — M25511 Pain in right shoulder: Secondary | ICD-10-CM | POA: Diagnosis not present

## 2023-01-09 DIAGNOSIS — M25511 Pain in right shoulder: Secondary | ICD-10-CM | POA: Diagnosis not present

## 2023-01-16 DIAGNOSIS — M25511 Pain in right shoulder: Secondary | ICD-10-CM | POA: Diagnosis not present

## 2023-01-18 DIAGNOSIS — M25511 Pain in right shoulder: Secondary | ICD-10-CM | POA: Diagnosis not present

## 2023-01-25 DIAGNOSIS — M25511 Pain in right shoulder: Secondary | ICD-10-CM | POA: Diagnosis not present

## 2023-01-30 DIAGNOSIS — M25511 Pain in right shoulder: Secondary | ICD-10-CM | POA: Diagnosis not present

## 2023-02-01 DIAGNOSIS — M25511 Pain in right shoulder: Secondary | ICD-10-CM | POA: Diagnosis not present

## 2023-02-05 DIAGNOSIS — M7541 Impingement syndrome of right shoulder: Secondary | ICD-10-CM | POA: Diagnosis not present

## 2023-02-05 DIAGNOSIS — M25511 Pain in right shoulder: Secondary | ICD-10-CM | POA: Diagnosis not present

## 2023-02-05 DIAGNOSIS — G8929 Other chronic pain: Secondary | ICD-10-CM | POA: Diagnosis not present

## 2023-02-06 DIAGNOSIS — M25511 Pain in right shoulder: Secondary | ICD-10-CM | POA: Diagnosis not present

## 2023-02-08 ENCOUNTER — Ambulatory Visit: Payer: Medicare HMO | Admitting: Podiatry

## 2023-02-13 ENCOUNTER — Ambulatory Visit: Payer: Medicare HMO | Admitting: Podiatry

## 2023-02-13 DIAGNOSIS — M25511 Pain in right shoulder: Secondary | ICD-10-CM | POA: Diagnosis not present

## 2023-02-13 DIAGNOSIS — M79675 Pain in left toe(s): Secondary | ICD-10-CM

## 2023-02-13 DIAGNOSIS — I739 Peripheral vascular disease, unspecified: Secondary | ICD-10-CM

## 2023-02-13 DIAGNOSIS — M79674 Pain in right toe(s): Secondary | ICD-10-CM

## 2023-02-13 DIAGNOSIS — B351 Tinea unguium: Secondary | ICD-10-CM | POA: Diagnosis not present

## 2023-02-13 NOTE — Progress Notes (Signed)
  Subjective:  Patient ID: Sarah Atkins, female    DOB: 02/07/1956,  MRN: [id number]  No chief complaint on file.   67 y.o. female presents with the above complaint. History confirmed with patient. Patient presenting with pain related to dystrophic thickened elongated nails. Patient is unable to trim own nails related to nail dystrophy and/or mobility issues. Patient does not have a history of T2DM.  Objective:  Physical Exam: warm, good capillary refill nail exam onychomycosis of the toenails, onycholysis, and dystrophic nails DP pulses palpable, PT pulses palpable, and protective sensation intact Left Foot:  Pain with palpation of nails due to elongation and dystrophic growth.  Right Foot: Pain with palpation of nails due to elongation and dystrophic growth.   Assessment:   1. Pain due to onychomycosis of toenails of both feet   2. PVD (peripheral vascular disease) ([place])      Plan:  Patient was evaluated and treated and all questions answered.    #Onychomycosis with pain  -Nails palliatively debrided as below. -Educated on self-care  Procedure: Nail Debridement Rationale: Pain Type of Debridement: manual, sharp debridement. Instrumentation: Nail nipper, rotary burr. Number of Nails: 10  Return in about 3 months (around 05/16/2023) for RFC.         Everitt Amber, DPM [facility]

## 2023-05-15 ENCOUNTER — Ambulatory Visit: Payer: Medicare HMO | Admitting: Podiatry

## 2023-05-29 ENCOUNTER — Ambulatory Visit: Payer: Medicare HMO | Admitting: Podiatry

## 2023-05-29 DIAGNOSIS — M79675 Pain in left toe(s): Secondary | ICD-10-CM | POA: Diagnosis not present

## 2023-05-29 DIAGNOSIS — I739 Peripheral vascular disease, unspecified: Secondary | ICD-10-CM

## 2023-05-29 DIAGNOSIS — M79674 Pain in right toe(s): Secondary | ICD-10-CM | POA: Diagnosis not present

## 2023-05-29 DIAGNOSIS — B351 Tinea unguium: Secondary | ICD-10-CM

## 2023-05-29 NOTE — Progress Notes (Signed)
  Subjective:  Patient ID: Sarah Atkins, female    DOB: 02/08/1956,  MRN: [id number]  Chief Complaint  Patient presents with   Nail Problem    Routine Foot Care-nail trim     67 y.o. female presents with the above complaint. History confirmed with patient. Patient presenting with pain related to dystrophic thickened elongated nails. Patient is unable to trim own nails related to nail dystrophy and/or mobility issues. Patient does not have a history of T2DM.  Objective:  Physical Exam: warm, good capillary refill nail exam onychomycosis of the toenails, onycholysis, and dystrophic nails DP pulses palpable, PT pulses palpable, and protective sensation intact Left Foot:  Pain with palpation of nails due to elongation and dystrophic growth.  Right Foot: Pain with palpation of nails due to elongation and dystrophic growth.   Assessment:   1. Pain due to onychomycosis of toenails of both feet   2. PVD (peripheral vascular disease) ([facility])     Plan:  Patient was evaluated and treated and all questions answered.    #Onychomycosis with pain  -Nails palliatively debrided as below. -Educated on self-care  Procedure: Nail Debridement Rationale: Pain Type of Debridement: manual, sharp debridement. Instrumentation: Nail nipper, rotary burr. Number of Nails: 10  Return in about 3 months (around 08/29/2023) for RFC.         Corinna Gab, DPM [facility] / [facility]

## 2023-06-05 DIAGNOSIS — Z96651 Presence of right artificial knee joint: Secondary | ICD-10-CM | POA: Diagnosis not present

## 2023-07-17 DIAGNOSIS — Z96651 Presence of right artificial knee joint: Secondary | ICD-10-CM | POA: Diagnosis not present

## 2023-09-04 ENCOUNTER — Ambulatory Visit: Payer: Medicare HMO | Admitting: Podiatry

## 2023-09-04 DIAGNOSIS — M79675 Pain in left toe(s): Secondary | ICD-10-CM | POA: Diagnosis not present

## 2023-09-04 DIAGNOSIS — I739 Peripheral vascular disease, unspecified: Secondary | ICD-10-CM

## 2023-09-04 DIAGNOSIS — M79674 Pain in right toe(s): Secondary | ICD-10-CM | POA: Diagnosis not present

## 2023-09-04 DIAGNOSIS — B351 Tinea unguium: Secondary | ICD-10-CM

## 2023-09-04 NOTE — Progress Notes (Signed)
Subjective:  Patient ID: Sarah Atkins, female    DOB: 1956-11-09,  MRN: [id number]  Chief Complaint  Patient presents with   Nail Problem    Rm 3- Routine nail care-      67 y.o. female presents with the above complaint. History confirmed with patient. Patient presenting with pain related to dystrophic thickened elongated nails. Patient is unable to trim own nails related to nail dystrophy and/or mobility issues. Patient does not have a history of T2DM.  Objective:  Physical Exam: warm, good capillary refill nail exam onychomycosis of the toenails, onycholysis, and dystrophic nails DP pulses palpable, PT pulses palpable, and protective sensation intact Left Foot:  Pain with palpation of nails due to elongation and dystrophic growth.  Right Foot: Pain with palpation of nails due to elongation and dystrophic growth.   Assessment:   1. Pain due to onychomycosis of toenails of both feet   2. PVD (peripheral vascular disease) ([facility])      Plan:  Patient was evaluated and treated and all questions answered.    #Onychomycosis with pain  -Nails palliatively debrided as below. -Educated on self-care  Procedure: Nail Debridement Rationale: Pain Type of Debridement: manual, sharp debridement. Instrumentation: Nail nipper, rotary burr. Number of Nails: 10  Return in about 3 months (around 12/05/2023) for RFC.         Corinna Gab, DPM [facility] / [facility]

## 2023-11-20 DIAGNOSIS — M1712 Unilateral primary osteoarthritis, left knee: Secondary | ICD-10-CM | POA: Diagnosis not present

## 2023-12-04 ENCOUNTER — Ambulatory Visit: Payer: Medicare Other | Admitting: Podiatry

## 2023-12-04 ENCOUNTER — Ambulatory Visit: Payer: Medicare HMO | Admitting: Podiatry

## 2023-12-04 ENCOUNTER — Encounter: Payer: Self-pay | Admitting: Podiatry

## 2023-12-04 DIAGNOSIS — M79674 Pain in right toe(s): Secondary | ICD-10-CM | POA: Diagnosis not present

## 2023-12-04 DIAGNOSIS — B351 Tinea unguium: Secondary | ICD-10-CM | POA: Diagnosis not present

## 2023-12-04 DIAGNOSIS — M79675 Pain in left toe(s): Secondary | ICD-10-CM | POA: Diagnosis not present

## 2023-12-04 DIAGNOSIS — I8311 Varicose veins of right lower extremity with inflammation: Secondary | ICD-10-CM | POA: Diagnosis not present

## 2023-12-04 DIAGNOSIS — I8312 Varicose veins of left lower extremity with inflammation: Secondary | ICD-10-CM | POA: Diagnosis not present

## 2023-12-04 DIAGNOSIS — I739 Peripheral vascular disease, unspecified: Secondary | ICD-10-CM

## 2023-12-04 NOTE — Progress Notes (Signed)
  Subjective:  Patient ID: Sarah Atkins, female    DOB: 1956/04/24,  MRN: [id number]  Chief Complaint  Patient presents with   RFc    RFC Not diabetic, no asa or other anti coags. No complaints today.     68 y.o. female presents with the above complaint. History confirmed with patient. Patient presenting with pain related to dystrophic thickened elongated nails. Patient is unable to trim own nails related to nail dystrophy and/or mobility issues. Patient does not have a history of T2DM. She does have varicose veins to both lower extremities with some lower extremity swelling.  Objective:  Physical Exam: warm, good capillary refill nail exam onychomycosis of the toenails, onycholysis, and dystrophic nails DP pulses palpable, PT pulses palpable, and protective sensation intact.  Varicosities and extensive telangiectasias appreciated bilaterally.  Mild edema present to the bilateral lower extremities. Left Foot:  Pain with palpation of nails due to elongation and dystrophic growth.  Right Foot: Pain with palpation of nails due to elongation and dystrophic growth.   Assessment:   1. Pain due to onychomycosis of toenails of both feet   2. PVD (peripheral vascular disease) ([facility])   3. Varicose veins of both lower extremities with inflammation      Plan:  Patient was evaluated and treated and all questions answered.    #Onychomycosis with pain  -Nails palliatively debrided as below. -Educated on self-care  Procedure: Nail Debridement Rationale: Pain Type of Debridement: manual, sharp debridement. Instrumentation: Nail nipper, rotary burr. Number of Nails: 10  # PVD with varicose veins - Recommend the use of over-the-counter compression stockings - Referral placed to vein specialist for evaluation and treatment of the varicose veins.  Return in about 3 months (around 03/03/2024) for Routine Foot Care.         Ethan Saddler, DPM [facility] / [facility]

## 2023-12-07 ENCOUNTER — Other Ambulatory Visit: Payer: Self-pay | Admitting: Endocrinology

## 2023-12-07 DIAGNOSIS — R42 Dizziness and giddiness: Secondary | ICD-10-CM

## 2023-12-13 ENCOUNTER — Other Ambulatory Visit: Payer: Self-pay

## 2023-12-17 ENCOUNTER — Ambulatory Visit
Admission: RE | Admit: 2023-12-17 | Discharge: 2023-12-17 | Disposition: A | Discharge status: Home / Self Care | Payer: Self-pay | Source: Ambulatory Visit | Attending: Endocrinology | Admitting: Endocrinology

## 2023-12-17 DIAGNOSIS — R42 Dizziness and giddiness: Secondary | ICD-10-CM

## 2024-01-08 ENCOUNTER — Other Ambulatory Visit: Payer: Self-pay

## 2024-01-30 ENCOUNTER — Ambulatory Visit
Admission: RE | Admit: 2024-01-30 | Discharge: 2024-01-30 | Disposition: A | Discharge status: Home / Self Care | Payer: Medicaid Other | Source: Ambulatory Visit | Attending: Endocrinology | Admitting: Endocrinology

## 2024-01-30 DIAGNOSIS — R42 Dizziness and giddiness: Secondary | ICD-10-CM

## 2024-01-30 MED ORDER — IOPAMIDOL (ISOVUE-300) INJECTION 61%
500.0000 mL | Freq: Once | INTRAVENOUS | Status: AC | PRN
  Administered 2024-01-30: 75 mL via INTRAVENOUS

## 2024-03-04 ENCOUNTER — Encounter: Payer: Self-pay | Admitting: Podiatry

## 2024-03-04 ENCOUNTER — Ambulatory Visit: Payer: Medicare Other | Admitting: Podiatry

## 2024-03-04 DIAGNOSIS — B351 Tinea unguium: Secondary | ICD-10-CM

## 2024-03-04 DIAGNOSIS — M79675 Pain in left toe(s): Secondary | ICD-10-CM | POA: Diagnosis not present

## 2024-03-04 DIAGNOSIS — M79674 Pain in right toe(s): Secondary | ICD-10-CM | POA: Diagnosis not present

## 2024-03-04 DIAGNOSIS — I739 Peripheral vascular disease, unspecified: Secondary | ICD-10-CM

## 2024-03-04 NOTE — Progress Notes (Signed)
  Subjective:  Patient ID: Sarah Atkins, female    DOB: 08-07-1956,  MRN: [id number]  Chief Complaint  Patient presents with   RFC    RFC today with out callous. She does state the left 1st toe is very sore today on the nail itself and not the surrounding skin. It does look a little ingrown and the nail come to a point in the center. Not diabetic, and no anti coag.     68 y.o. female presents with the above complaint. History confirmed with patient. Patient presenting with pain related to dystrophic thickened elongated nails. Patient is unable to trim own nails related to nail dystrophy and/or mobility issues. Patient does not have a history of T2DM. She does have varicose veins to both lower extremities with some lower extremity swelling. Left hallux toenail especially painful.  Objective:  Physical Exam: warm, good capillary refill nail exam onychomycosis of the toenails, onycholysis, and dystrophic nails DP pulses palpable, PT pulses palpable, and protective sensation intact.  Varicosities and extensive telangiectasias appreciated bilaterally.  Mild edema present to the bilateral lower extremities. Left Foot:  Pain with palpation of nails due to elongation and dystrophic growth. Left hallux toenail especially painful with nail dystrophy Right Foot: Pain with palpation of nails due to elongation and dystrophic growth.   Assessment:   1. Pain due to onychomycosis of toenails of both feet   2. PVD (peripheral vascular disease) ([facility])      Plan:  Patient was evaluated and treated and all questions answered.    #Onychomycosis with pain  -Nails palliatively debrided as below. -Educated on self-care - Advised patient to return to clinic in the next couple of weeks of the left hallux toenail continues to be painful, we could remove the affected toenail if needed.  Procedure: Nail Debridement Rationale: Pain Type of Debridement: manual, sharp debridement. Instrumentation: Nail  nipper, rotary burr. Number of Nails: 10  # PVD with varicose veins - Recommend the use of over-the-counter compression stockings - Previously placed referral to vein specialist for varicose veins  Return in about 3 months (around 06/03/2024) for Routine Foot Care.         Eve Hinders, DPM [facility] / [facility]

## 2024-06-03 ENCOUNTER — Ambulatory Visit: Admitting: Podiatry

## 2024-06-03 DIAGNOSIS — M79675 Pain in left toe(s): Secondary | ICD-10-CM | POA: Diagnosis not present

## 2024-06-03 DIAGNOSIS — M79674 Pain in right toe(s): Secondary | ICD-10-CM | POA: Diagnosis not present

## 2024-06-03 DIAGNOSIS — L600 Ingrowing nail: Secondary | ICD-10-CM

## 2024-06-03 DIAGNOSIS — B351 Tinea unguium: Secondary | ICD-10-CM | POA: Diagnosis not present

## 2024-06-03 DIAGNOSIS — I739 Peripheral vascular disease, unspecified: Secondary | ICD-10-CM | POA: Diagnosis not present

## 2024-06-03 NOTE — Progress Notes (Unsigned)
  Subjective:  Patient ID: Sarah Atkins, female    DOB: 1956/06/17,  MRN: [id number]  Chief Complaint  Patient presents with   Nail Problem    Thick painful toenails, 3 month follow up     68 y.o. female presents with the above complaint. History confirmed with patient. Patient presenting with pain related to dystrophic thickened elongated nails. Patient is unable to trim own nails related to nail dystrophy and/or mobility issues. Patient does not have a history of T2DM. She does have varicose veins to both lower extremities with some lower extremity swelling. Left hallux toenail especially painful.  This is affecting the medial border.  Objective:  Physical Exam: warm, good capillary refill nail exam onychomycosis of the toenails, onycholysis, and dystrophic nails DP pulses palpable, PT pulses palpable, and protective sensation intact.  Varicosities and extensive telangiectasias appreciated bilaterally.  Mild edema present to the bilateral lower extremities. Left Foot:  Pain with palpation of nails due to elongation and dystrophic growth. Left hallux toenail especially painful with nail dystrophy.  Medial border most symptomatic Right Foot: Pain with palpation of nails due to elongation and dystrophic growth.   Assessment:   1. Pain due to onychomycosis of toenails of both feet   2. PVD (peripheral vascular disease) ([facility])   3. Ingrown toenail of left foot      Plan:  Patient was evaluated and treated and all questions answered.  #Onychomycosis with pain  -Nails palliatively debrided as below. -Educated on self-care  Procedure: Nail Debridement Rationale: Pain Type of Debridement: manual, sharp debridement. Instrumentation: Nail nipper, rotary burr. Number of Nails: 10  # Ingrown toenail left first toe - Advised patient to return to clinic in the next couple of weeks of the left hallux toenail continues to be painful, we could remove the affected toenail if needed.   Medial border most affected - Likely planning on this in the next 1 to 2 weeks. -I certify that this diagnosis represents a distinct and separate diagnosis that requires evaluation and treatment separate from other procedures or diagnosis   # PVD with varicose veins - Recommend the use of over-the-counter compression stockings - Previously placed referral to vein specialist for varicose veins  Return in about 1 week (around 06/10/2024) for ingrown toenail.         Ethan Saddler, DPM [facility] / [facility]

## 2024-06-06 ENCOUNTER — Encounter: Payer: Self-pay | Admitting: Podiatry

## 2024-06-10 ENCOUNTER — Ambulatory Visit: Admitting: Podiatry

## 2024-06-30 ENCOUNTER — Encounter: Payer: Self-pay | Admitting: Podiatry

## 2024-06-30 ENCOUNTER — Ambulatory Visit: Admitting: Podiatry

## 2024-06-30 DIAGNOSIS — L600 Ingrowing nail: Secondary | ICD-10-CM

## 2024-06-30 MED ORDER — NEOMYCIN-POLYMYXIN-HC 3.5-10000-1 OT SUSP: OTIC | 0 refills | Status: DC

## 2024-06-30 NOTE — Patient Instructions (Signed)
 Place 1/4 cup of epsom salts in a quart of warm tap water.  Submerge your foot or feet in the solution and soak for 20 minutes.  This soak should be done twice a day.  Next, remove your foot or feet from solution, blot dry the affected area. Apply antibiotic drops and cover if instructed by your doctor.   IF YOUR SKIN BECOMES IRRITATED WHILE USING THESE INSTRUCTIONS, IT IS OKAY TO SWITCH TO  WHITE VINEGAR AND WATER.  As another alternative soak, you may use antibacterial soap and water.  Monitor for any signs/symptoms of infection. Call the office immediately if any occur or go directly to the emergency room. Call with any questions/concerns.

## 2024-06-30 NOTE — Progress Notes (Signed)
       Subjective:  Patient ID: Sarah Atkins, female    DOB: 26-Dec-1955,  MRN: [id number]  Sarah Atkins presents to clinic today for:  Chief Complaint  Patient presents with   Ingrown Toenail    Left hallux, bilateral nail borders. Not infected. No anti coag. Not diabetic.   Patient comes in for above complaint.  Has been dealing with ongoing pain to the left first toe medial border.  She denies signs of infection.  Has had some relief with recent nail trim but the soreness has been ongoing.  PCP is [place] Martin, Garnette, MD.  No Known Allergies  Review of Systems: Negative except as noted in the HPI.  Objective:  There were no vitals filed for this visit.  Sarah Atkins is a pleasant 68 y.o. female in NAD. AAO x 3.  Vascular Examination: Capillary refill time is less than 3 seconds to toes bilateral. Palpable pedal pulses b/l LE. Digital hair present b/l. No pedal edema b/l. Skin temperature gradient WNL b/l.  Varicosities and telangiectasias present bilaterally.  No cyanosis or clubbing noted b/l.   Dermatological Examination: There is incurvation of the left hallux medial nail border.  There is pain on palpation of the affected nail border.  No significant erythema or edema present.  No drainage.  Neurological Examination: Protective sensation intact with Semmes-Weinstein 10 gram monofilament b/l LE. Vibratory sensation intact b/l LE.      No data to display           Assessment/Plan: 1. Ingrown toenail of left foot     No orders of the defined types were placed in this encounter.   Discussed patient's condition today.  After obtaining patient consent, the left hallux was anesthetized with a 50:50 mixture of 1% lidocaine  plain and 0.5% bupivacaine plain for a total of 3cc's administered.  Upon confirmation of anesthesia, a freer elevator was utilized to free the left hallux medial nail border from the nail bed.  The nail border was then avulsed  proximal to the eponychium and removed in toto.  The area was inspected for any remaining spicules.  A chemical matrixectomy was performed with phenol and neutralized with isopropyl alcohol  solution.  Antibiotic ointment and a DSD were applied, followed by a Coban dressing.  Patient tolerated the anesthetic and procedure well and will f/u in 2-3 weeks for recheck.  Patient given post-procedure instructions for daily 20-minute Epsom salt soaks, antibiotic drops and daily use of Bandaids until toe starts to dry / form eschar.   Prescription for Corticosporin drops sent to the patient's pharmacy.  Recommended over-the-counter Tylenol  every 4-6 hours as needed for pain.   Return in about 2 weeks (around 07/14/2024) for Nail check.   Ethan Saddler, DPM, AACFAS [facility]     [address] [place], [place] [address]                Office [phone]  Fax [phone]

## 2024-07-08 ENCOUNTER — Ambulatory Visit: Admitting: Podiatry

## 2024-07-08 ENCOUNTER — Encounter: Payer: Self-pay | Admitting: Podiatry

## 2024-07-08 ENCOUNTER — Ambulatory Visit (INDEPENDENT_AMBULATORY_CARE_PROVIDER_SITE_OTHER)

## 2024-07-08 DIAGNOSIS — M76822 Posterior tibial tendinitis, left leg: Secondary | ICD-10-CM

## 2024-07-08 DIAGNOSIS — M2142 Flat foot [pes planus] (acquired), left foot: Secondary | ICD-10-CM | POA: Diagnosis not present

## 2024-07-08 DIAGNOSIS — L03032 Cellulitis of left toe: Secondary | ICD-10-CM | POA: Diagnosis not present

## 2024-07-08 DIAGNOSIS — M19072 Primary osteoarthritis, left ankle and foot: Secondary | ICD-10-CM

## 2024-07-08 DIAGNOSIS — M7752 Other enthesopathy of left foot: Secondary | ICD-10-CM | POA: Diagnosis not present

## 2024-07-08 DIAGNOSIS — M778 Other enthesopathies, not elsewhere classified: Secondary | ICD-10-CM | POA: Diagnosis not present

## 2024-07-08 DIAGNOSIS — L02612 Cutaneous abscess of left foot: Secondary | ICD-10-CM

## 2024-07-08 MED ORDER — DOXYCYCLINE HYCLATE 100 MG PO TABS: 100.0000 mg | ORAL_TABLET | Freq: Two times a day (BID) | ORAL | 0 refills | Status: AC

## 2024-07-08 MED ORDER — TRIAMCINOLONE ACETONIDE 10 MG/ML IJ SUSP
10.0000 mg | Freq: Once | INTRAMUSCULAR | Status: AC
  Administered 2024-07-08: 10 mg

## 2024-07-08 NOTE — Progress Notes (Unsigned)
 Chief Complaint  Patient presents with   PNA Check with foot/ankle pain    PNA of left hallux, still has drainage present and toe is slightly red. Little pain in the toe. Foot and ankle pain for a long time. Pain in the foot is on the medial side with a hard knot present, ankle has had pain in it worse then normal. Not diabetic and no anti coag.     HPI: 68 y.o. female presents today with left foot and ankle pain.  She is 1 week status post PNA of left hallux. She denies any injury. Relates history of arthritis.  Most of her pain is along the medial ankle and hindfoot towards osseous prominence correlating with PT tendon.  Regarding the P&A site, she does report some soreness to the area and has been having a little bit of drainage.  Past Medical History:  Diagnosis Date   Varicose veins     Past Surgical History:  Procedure Laterality Date   KNEE DEBRIDEMENT Right     No Known Allergies  ROS    Physical Exam: There were no vitals filed for this visit.  General: The patient is alert and oriented x3 in no acute distress.  Dermatology: Left first toe medial border some scant yellowish drainage present.  Some mild erythema to the area.  Mild tenderness on palpation.  Pedal skin otherwise intact without open wounds or lesions.  Vascular: Palpable pedal pulses bilaterally. Capillary refill within normal limits.  Varicosities and extensive telangiectasias appreciated bilaterally.  Mild edema present to bilateral lower extremities.  Neurological: Light touch sensation grossly intact bilateral feet.   Musculoskeletal Exam: Muscle strength 5/5 all major muscle groups.  Pes planus foot type noted.  Tenderness on palpation of left medial navicular tuberosity and PT tendon along medial insertion towards this area.  Ankle joint range of motion smooth without crepitus and without pain.  Radiographic Exam: Left foot and ankle radiographs weightbearing 07/08/2024 Arthritic appearing  changes to the left ankle noted with joint space narrowing and varus alignment of the ankle.  Decreased tibiotalar joint space along medial shoulder.  Significant pes planus foot type noted with  joint sag present.  Prominent navicular tuberosity present, talar head uncoverage present.  Assessment/Plan of Care: 1. Arthritis of left ankle   2. Pes planus of left foot   3. Posterior tibial tendon dysfunction (PTTD) of left lower extremity   4. Cellulitis and abscess of toe of left foot      Meds ordered this encounter  Medications   triamcinolone  acetonide (KENALOG ) 10 MG/ML injection 10 mg   doxycycline  (VIBRA -TABS) 100 MG tablet    Sig: Take 1 tablet (100 mg total) by mouth 2 (two) times daily for 10 days.    Dispense:  20 tablet    Refill:  0   None  Discussed clinical findings with patient today.  Radiographs reviewed with the patient.  Discussed the etiology, pathomechanics and treatment options in detail with the patient, we also reviewed today's radiographs in detail.  We reviewed pes planus findings in the setting of left ankle arthritis and PTTD. Today I recommended that we proceed with Tri-Lock ankle brace to support the ankle and limit excessive pronation forces on PT tendon.  Home exercises were reviewed with patient.  Deferring p.o. NSAIDs due to history of CKD stage III.  Did recommend power steps as well, she is deferring for now due to coming in open toed shoes but do  recommend that she applies these to support the midfoot. - The ankle arthritis, PTTD and pes planus are separate and unrelated diagnoses from the left first ingrown toenail which required separate evaluation and management  Verbal consent was obtained to administer corticosteroid injection to the left PT tendon just proximal to its insertion on the navicular.  Alcohol  skin prep.  Injected 0.5 cc of 0.5% mixed with 0.5 cc each of Kenalog  10 and dexamethasone .  Band-Aid applied.  Patient tolerated well.   # Left  first toe PNA -Continue soaking and bandaging, use of the Corticosporin - Will send in course of oral doxycycline  twice daily out of abundance of caution due to some redness and skin irritation at the PNA resection site  Follow-up in approximately 4 weeks, or sooner if symptoms worsen   Sina Lucchesi L. Lamount MAUL, AACFAS [facility]     [address] [place], [place] [address]                Office [phone]  Fax [phone]

## 2024-07-14 ENCOUNTER — Ambulatory Visit: Admitting: Podiatry

## 2024-08-04 ENCOUNTER — Ambulatory Visit: Payer: Self-pay

## 2024-08-04 NOTE — Telephone Encounter (Signed)
 Patient states that her PCP is also her endocrinologist. States he said he could be her PCP. Advised to call their office and ask if he is able to see her or she may go to UC until established with a PCP. States she will call him to ask if she can be seen there, will call if she decides to establish care with [facility].  FYI Only or Action Required?: FYI only for provider.  Patient was last seen in primary care on [facility].  Called Nurse Triage reporting Back Pain.  Symptoms began several weeks ago.  Interventions attempted: Rest, hydration, or home remedies.  Symptoms are: unchanged.  Triage Disposition: Call PCP Now (overriding See HCP Within 4 Hours (Or PCP Triage))  Patient/caregiver understands and will follow disposition?: Yes Reason for Disposition  [1] SEVERE back pain (e.g., excruciating, unable to do any normal activities) AND [2] not improved 2 hours after pain medicine  Answer Assessment - Initial Assessment Questions Pt states that she has had back pain x 3 weeks. Was seen at UC 3 times, received injection and cortisone. Has been dx with severe arthritis in her ankle. Saw knee Dr. Friday and states he applied a knee brace, suspected walking d/t knee is causing back pain.  1. ONSET: When did the pain begin? (e.g., minutes, hours, days)     3 weeks  2. LOCATION: Where does it hurt? (upper, mid or lower back)     Mid-right back  3. SEVERITY: How bad is the pain?  (e.g., Scale 1-10; mild, moderate, or severe)     Severe sharp pain  4. PATTERN: Is the pain constant? (e.g., yes, no; constant, intermittent)      Constant when not sitting  5. RADIATION: Does the pain shoot into your legs or somewhere else?     Denies  Protocols used: Back Pain-A-AH Copied from CRM [id number]. Topic: Clinical - Red Word Triage >> Aug 04, 2024  9:58 AM Tobias L wrote: Red Word that prompted transfer to Nurse Triage: back pain, going on 3 weeks, rating pain at 7/8 out of  10. Has seen UC 3 times and no improvement.   Requesting appointment at [facility]

## 2024-08-05 ENCOUNTER — Encounter: Payer: Self-pay | Admitting: Podiatry

## 2024-08-05 ENCOUNTER — Ambulatory Visit: Admitting: Podiatry

## 2024-08-05 DIAGNOSIS — L600 Ingrowing nail: Secondary | ICD-10-CM | POA: Diagnosis not present

## 2024-08-05 DIAGNOSIS — M19072 Primary osteoarthritis, left ankle and foot: Secondary | ICD-10-CM

## 2024-08-05 NOTE — Progress Notes (Unsigned)
 Chief Complaint  Patient presents with   Ankle Injury    Left ankle still painful 5/10. Not diabetic. No anti coag   Nail Problem    Left hallux underneath nail  still having pain, red, unable to wear closed toe shoes    HPI: 68 y.o. female presents today following up for left ankle pain.  She did have corticosteroid injection to PT tendon medial ankle at prior visit.  This is doing better.  Most of the pain today is along anterior lateral ankle.  Reports soreness.  Has not had great wound relief using the Tri-Lock brace and also reports ongoing hip and knee problems.  She also had PNA procedure left first toe about 1 month ago and reports ongoing soreness to the area.  She denies signs of infection. Past Medical History:  Diagnosis Date   Varicose veins     Past Surgical History:  Procedure Laterality Date   KNEE DEBRIDEMENT Right     No Known Allergies  ROS    Physical Exam: There were no vitals filed for this visit.  General: The patient is alert and oriented x3 in no acute distress.  Dermatology: Left first toe medial border there is evidence of dried drainage present.  No erythema about the area.  No tenderness on palpation of the P&A site or along the nail plate but there is some soreness first toe dorsally proximal to the nail plate.  The PNA site does appear well-healed.  Vascular: Palpable pedal pulses bilaterally. Capillary refill within normal limits.  Varicosities and extensive telangiectasias appreciated bilaterally.  Mild edema present to bilateral lower extremities.  Neurological: Light touch sensation grossly intact bilateral feet.   Musculoskeletal Exam: Muscle strength 5/5 all major muscle groups.  Pes planus foot type noted.  No tenderness on palpation of PT tendons today.  There is tenderness on palpation of anterior lateral ankle gutter today.  Ankle joint range of motion noted without crepitus.  Radiographic Exam: Left foot and ankle radiographs  weightbearing 07/08/2024 Arthritic appearing changes to the left ankle noted with joint space narrowing and varus alignment of the ankle.  Decreased tibiotalar joint space along medial shoulder.  Significant pes planus foot type noted with [place] joint sag present.  Prominent navicular tuberosity present, talar head uncoverage present.  Assessment/Plan of Care: 1. Arthritis of left ankle   2. Ingrown toenail of left foot      No orders of the defined types were placed in this encounter.  None  Discussed clinical findings with patient today.    Reviewed prior radiographs with the patient showing arthritic changes to the left ankle.  We did discuss corticosteroid injection to the left ankle however deferring this for now due to recent steroid injections and oral steroids for other issues.  Discussed good supportive shoe gear.  Can try to continue to use ankle brace.  Also discussed quality over-the-counter insert.  Previously did discuss quality over-the-counter insert and I advised that she continue with this as well.  She is not interested in significant surgery.  We will continue to monitor at this time.   Does have some ongoing soreness to the left first toe where she had PNA procedure done.  Discussed no signs of acute bacterial infection noted today.  Monitor this area.  Can call in oral antibiotics if she does notice any changes.  The site does appear well healed.  She can continue soaking if she does notice some relief from  this area.  Did dispense gel toe cap as well today.  Follow-up as needed symptoms worsen or if new complaints arise.   Jadien [facility] L. Lamount MAUL, AACFAS [facility]     [address] [place], [place] [address]                Office [phone]  Fax [phone]

## 2024-09-02 ENCOUNTER — Encounter: Payer: Self-pay | Admitting: Podiatry

## 2024-09-02 ENCOUNTER — Ambulatory Visit: Admitting: Podiatry

## 2024-09-02 DIAGNOSIS — M79675 Pain in left toe(s): Secondary | ICD-10-CM | POA: Diagnosis not present

## 2024-09-02 DIAGNOSIS — I739 Peripheral vascular disease, unspecified: Secondary | ICD-10-CM

## 2024-09-02 DIAGNOSIS — M79674 Pain in right toe(s): Secondary | ICD-10-CM | POA: Diagnosis not present

## 2024-09-02 DIAGNOSIS — B351 Tinea unguium: Secondary | ICD-10-CM

## 2024-09-02 NOTE — Progress Notes (Signed)
  Subjective:  Patient ID: Sarah Atkins, female    DOB: 11/06/1956,  MRN: [id number]  Chief Complaint  Patient presents with   RFC    RFC cannot tell if there is a callous on R1 tip or if its dry skin Not diabetic, no anti coag.     68 y.o. female presents with the above complaint. History confirmed with patient. Patient presenting with pain related to dystrophic thickened elongated nails. Patient is unable to trim own nails related to nail dystrophy and/or mobility issues. Patient does not have a history of T2DM. She does have varicose veins to both lower extremities with some lower extremity swelling. Left PNA site did go on to heal well. Objective:  Physical Exam: warm, good capillary refill nail exam onychomycosis of the toenails, onycholysis, and dystrophic nails DP pulses palpable, PT pulses palpable, and protective sensation intact.  Varicosities and extensive telangiectasias appreciated bilaterally.  Mild edema present to the bilateral lower extremities. Left Foot:  Pain with palpation of nails due to elongation and dystrophic growth. Right Foot: Pain with palpation of nails due to elongation and dystrophic growth.   Assessment:   1. Pain due to onychomycosis of toenails of both feet   2. PVD (peripheral vascular disease)       Plan:  Patient was evaluated and treated and all questions answered.    #Onychomycosis with pain  -Nails palliatively debrided as below. -Educated on self-care  Procedure: Nail Debridement Rationale: Pain Type of Debridement: manual, sharp debridement. Instrumentation: Nail nipper, rotary burr. Number of Nails: 10   # PVD with varicose veins - Using otc compression stockings  Return in about 3 months (around 12/03/2024) for Routine Foot Care.         Ethan Saddler, DPM [facility] / [facility]

## 2024-10-02 ENCOUNTER — Ambulatory Visit

## 2024-10-02 VITALS — BP 110/62 | HR 85 | Temp 97.8°F | Ht 64.0 in | Wt 182.0 lb

## 2024-10-02 DIAGNOSIS — N1831 Chronic kidney disease, stage 3a: Secondary | ICD-10-CM | POA: Diagnosis not present

## 2024-10-02 DIAGNOSIS — K219 Gastro-esophageal reflux disease without esophagitis: Secondary | ICD-10-CM | POA: Diagnosis not present

## 2024-10-02 DIAGNOSIS — E039 Hypothyroidism, unspecified: Secondary | ICD-10-CM

## 2024-10-02 DIAGNOSIS — I10 Essential (primary) hypertension: Secondary | ICD-10-CM | POA: Diagnosis not present

## 2024-10-02 DIAGNOSIS — Z1231 Encounter for screening mammogram for malignant neoplasm of breast: Secondary | ICD-10-CM

## 2024-10-02 DIAGNOSIS — E2839 Other primary ovarian failure: Secondary | ICD-10-CM

## 2024-10-02 DIAGNOSIS — E782 Mixed hyperlipidemia: Secondary | ICD-10-CM

## 2024-10-02 NOTE — Progress Notes (Unsigned)
 Subjective:  Patient ID: Sarah Atkins, female    DOB: 1956-07-25  Age: 68 y.o. MRN: [id number]  Chief Complaint  Patient presents with   New to establish    Patient is establishing as a new patient.  HPI Discussed the use of AI scribe software for clinical note transcription with the patient, who gave verbal consent to proceed.  History of Present Illness   Sarah Atkins is a 68 year old female who presents TO ESTABLISH CARE AND  management of her chronic conditions.  Spinal deformity and musculoskeletal pain - Scoliosis with progressive worsening of spinal curvature - Received lower back injections for scoliosis pain one day prior to visit - Ongoing back pain managed with meloxicam 15 mg daily - Diclofenac prescribed for knee pain following right knee replacement three years ago; does not take meloxicam and diclofenac together  Hypertension - Blood pressure stable for years - Managed with benazepril 20 mg daily (full pill) and hydrochlorothiazide 12.5 mg daily (half pill)  Thyroid dysfunction - On levothyroxine 175 mcg daily for thyroid management - Thyroid specialist prescribes levothyroxine - Recent blood work on August 13, 2024 showed normal TSH levels  Ocular symptoms - Recent onset of itching and matting of both eyes, initially starting with the left eye - No conjunctivitis - Symptoms attributed to allergies  Gastroesophageal reflux disease - Acid reflux managed with Protonix 40 mg daily  Allergic rhinitis - Allergy symptoms managed with Claritin - Allergy to prednisone, which causes emotional lability ('bust out crying')          10/02/2024    1:44 PM  Depression screen PHQ 2/9  Decreased Interest 0  Down, Depressed, Hopeless 0  PHQ - 2 Score 0         10/02/2024    1:44 PM  Fall Risk  Falls in the past year? 0  Was there an injury with Fall? 0  Fall Risk Category Calculator 0  Patient at Risk for Falls Due to No Fall Risks   Fall risk Follow up Falls evaluation completed     Current Outpatient Medications on File Prior to Visit  Medication Sig Dispense Refill   benazepril (LOTENSIN) 20 MG tablet Take 1 tablet by mouth daily.     Cholecalciferol (VITAMIN D3) 50 MCG (2000 UT) TABS Take 4 tablets by mouth daily.     diclofenac (VOLTAREN) 75 MG EC tablet Take 75 mg by mouth daily as needed for moderate pain (pain score 4-6).     GOODSENSE PAIN RELIEF 325 MG tablet Take 325 mg by mouth 2 (two) times daily.     GOODSENSE STIMULANT LAXATIVE 8.6-50 MG tablet Take 2 tablets by mouth at bedtime.     hydrochlorothiazide (HYDRODIURIL) 25 MG tablet Take 25 mg by mouth daily. (Patient taking differently: Take 12.5 mg by mouth daily.)     levothyroxine (SYNTHROID, LEVOTHROID) 175 MCG tablet Take 175 mcg by mouth daily before breakfast.     meloxicam (MOBIC) 15 MG tablet Take 15 mg by mouth daily.     Multiple Vitamin (MULTIVITAMIN) tablet Take 1 tablet by mouth daily.     pantoprazole (PROTONIX) 40 MG tablet Take 40 mg by mouth daily.     loratadine (CLARITIN) 10 MG tablet Take 10 mg by mouth daily.     No current facility-administered medications on file prior to visit.  . Social History   Socioeconomic History   Marital status: Married    Spouse name: Not on file  Number of children: Not on file   Years of education: Not on file   Highest education level: Not on file  Occupational History   Not on file  Tobacco Use   Smoking status: Never   Smokeless tobacco: Never  Substance and Sexual Activity   Alcohol  use: No   Drug use: No   Sexual activity: Not on file  Other Topics Concern   Not on file  Social History Narrative   Not on file   Social Drivers of Health   Financial Resource Strain: Not on file  Food Insecurity: Not on file  Transportation Needs: Not on file  Physical Activity: Not on file  Stress: Not on file  Social Connections: Not on file   Past Medical History:  Diagnosis Date    Varicose veins    Family History  Problem Relation Age of Onset   Diabetes Mother    Cancer Mother    Hypertension Mother    Hypertension Father    Breast cancer Neg Hx     Review of Systems  Constitutional:  Negative for appetite change, fatigue and fever.  HENT:  Negative for congestion, ear pain, sinus pressure and sore throat.   Eyes:  Positive for redness and itching.  Respiratory:  Positive for cough. Negative for chest tightness, shortness of breath and wheezing.   Cardiovascular:  Negative for chest pain and palpitations.  Gastrointestinal:  Negative for abdominal pain, constipation, diarrhea, nausea and vomiting.  Genitourinary:  Negative for dysuria and hematuria.  Musculoskeletal:  Negative for arthralgias, back pain, joint swelling and myalgias.  Skin:  Negative for rash.  Neurological:  Negative for dizziness, weakness and headaches.  Psychiatric/Behavioral:  Negative for dysphoric mood. The patient is not nervous/anxious.      Objective:  BP 110/62 (BP Location: Left Arm, Patient Position: Sitting)   Pulse 85   Temp 97.8 F (36.6 C) (Temporal)   Ht 5' 4 (1.626 m)   Wt 182 lb (82.6 kg)   SpO2 96%   BMI 31.24 kg/m      10/02/2024    1:44 PM 11/30/2022    1:32 PM 09/17/2014   10:30 AM  BP/Weight  Systolic BP 110 132 114  Diastolic BP 62 84 80  Wt. (Lbs) 182  196.1  BMI 31.24 kg/m2  33.66 kg/m2    Physical Exam Vitals and nursing note reviewed.  Constitutional:      Appearance: She is obese.  HENT:     Head: Normocephalic and atraumatic.  Cardiovascular:     Rate and Rhythm: Normal rate and regular rhythm.  Pulmonary:     Effort: Pulmonary effort is normal.     Breath sounds: Normal breath sounds.  Neurological:     Mental Status: She is alert.    Diabetic Foot Exam - Simple   No data filed      Lab Results  Component Value Date   WBC 13.9 (H) 10/02/2024   HGB 12.6 10/02/2024   HCT 38.0 10/02/2024   PLT 228 10/02/2024   GLUCOSE 109  (H) 10/02/2024   CHOL 166 10/02/2024   TRIG 127 10/02/2024   HDL 50 10/02/2024   LDLCALC 93 10/02/2024   ALT 14 10/02/2024   AST 12 10/02/2024   NA 143 10/02/2024   K 3.9 10/02/2024   CL 105 10/02/2024   CREATININE 1.23 (H) 10/02/2024   BUN 29 (H) 10/02/2024   CO2 24 10/02/2024   TSH 0.288 (L) 10/02/2024  Assessment & Plan:   Chronic kidney disease, stage 3a (HCC) Assessment & Plan: Possibly secondary to chronic HTN Will continue to monitor Patient to avoid NSAIDs and nephrotoxic medications  Orders: -     CBC with Differential/Platelet  Essential hypertension Assessment & Plan:  Essential hypertension Hypertension well-controlled with current medication regimen. - Continue benazepril 20 mg daily. - Continue hydrochlorothiazide 12.5 mg daily.  Orders: -     Comprehensive metabolic panel with GFR  Gastro-esophageal reflux disease without esophagitis Assessment & Plan: Gastroesophageal reflux disease (GERD) GERD managed with Protonix. - Continue Protonix 40 mg daily.   Acquired hypothyroidism Assessment & Plan: Hypothyroidism Well-managed with current levothyroxine dosage. Recent TSH levels within normal range. - Continue levothyroxine 175 mcg daily.  Orders: -     TSH  Encounter for screening mammogram for malignant neoplasm of breast -     3D Screening Mammogram, Left and Right; Future  Estrogen deficiency -     DG Bone Density; Future  Mixed hyperlipidemia -     Lipid panel  Assessment and Plan    Low back pain due to scoliosis Chronic low back pain secondary to scoliosis, with recent exacerbation. Recent injections administered for pain management. - Continue meloxicam 15 mg daily for pain management.  Allergic rhinitis with ocular symptoms Allergic rhinitis with recent ocular symptoms including itching and matting of eyes. Currently on Claritin for allergies. - Recommended over-the-counter anti-allergy eye drops for ocular  symptoms.  Osteoarthritis, status post right knee replacement Osteoarthritis managed post right knee replacement. - Continue current management.  General Health Maintenance Routine health maintenance discussed, including mammogram and bone density test. Blood work ordered for routine monitoring. - Ordered mammogram and bone density test. - Ordered blood work for routine monitoring. - Encouraged healthy diet and regular physical activity. - Discussed flu vaccination; she prefers to wait.         No orders of the defined types were placed in this encounter.    Orders Placed This Encounter  Procedures   MM 3D SCREENING MAMMOGRAM BILATERAL BREAST   DG BONE DENSITY (DXA)   CBC with Differential/Platelet   Comprehensive metabolic panel with GFR   Lipid panel   TSH       Follow-up: Return in about 3 months (around 01/02/2025) for chronic disease follow up.  AVS was given to patient prior to departure.    Jacquelynn Friend, MD [facility] [phone]

## 2024-10-03 ENCOUNTER — Ambulatory Visit: Payer: Self-pay

## 2024-10-03 LAB — LIPID PANEL
Chol/HDL Ratio: 3.3 ratio (ref 0.0–4.4)
Cholesterol, Total: 166 mg/dL (ref 100–199)
HDL: 50 mg/dL (ref >39)
LDL Chol Calc (NIH): 93 mg/dL (ref 0–99)
Triglycerides: 127 mg/dL (ref 0–149)
VLDL Cholesterol Cal: 23 mg/dL (ref 5–40)

## 2024-10-03 LAB — CBC WITH DIFFERENTIAL/PLATELET
Basophils Absolute: 0 x10E3/uL (ref 0.0–0.2)
Basos: 0 %
EOS (ABSOLUTE): 0.1 x10E3/uL (ref 0.0–0.4)
Eos: 0 %
Hematocrit: 38 % (ref 34.0–46.6)
Hemoglobin: 12.6 g/dL (ref 11.1–15.9)
Immature Grans (Abs): 0.1 x10E3/uL (ref 0.0–0.1)
Immature Granulocytes: 1 %
Lymphocytes Absolute: 2.2 x10E3/uL (ref 0.7–3.1)
Lymphs: 16 %
MCH: 28.3 pg (ref 26.6–33.0)
MCHC: 33.2 g/dL (ref 31.5–35.7)
MCV: 85 fL (ref 79–97)
Monocytes Absolute: 1.2 x10E3/uL — ABNORMAL HIGH (ref 0.1–0.9)
Monocytes: 9 %
Neutrophils Absolute: 10.3 x10E3/uL — ABNORMAL HIGH (ref 1.4–7.0)
Neutrophils: 74 %
Platelets: 228 x10E3/uL (ref 150–450)
RBC: 4.45 x10E6/uL (ref 3.77–5.28)
RDW: 14.2 % (ref 11.7–15.4)
WBC: 13.9 x10E3/uL — ABNORMAL HIGH (ref 3.4–10.8)

## 2024-10-03 LAB — COMPREHENSIVE METABOLIC PANEL WITH GFR
ALT: 14 IU/L (ref 0–32)
AST: 12 IU/L (ref 0–40)
Albumin: 3.9 g/dL (ref 3.9–4.9)
Alkaline Phosphatase: 50 IU/L (ref 49–135)
BUN/Creatinine Ratio: 24 (ref 12–28)
BUN: 29 mg/dL — ABNORMAL HIGH (ref 8–27)
Bilirubin Total: 0.2 mg/dL (ref 0.0–1.2)
CO2: 24 mmol/L (ref 20–29)
Calcium: 9.3 mg/dL (ref 8.7–10.3)
Chloride: 105 mmol/L (ref 96–106)
Creatinine, Ser: 1.23 mg/dL — ABNORMAL HIGH (ref 0.57–1.00)
Globulin, Total: 2.1 g/dL (ref 1.5–4.5)
Glucose: 109 mg/dL — ABNORMAL HIGH (ref 70–99)
Potassium: 3.9 mmol/L (ref 3.5–5.2)
Sodium: 143 mmol/L (ref 134–144)
Total Protein: 6 g/dL (ref 6.0–8.5)
eGFR: 48 mL/min/1.73 — ABNORMAL LOW (ref >59)

## 2024-10-03 LAB — TSH: TSH: 0.288 u[IU]/mL — ABNORMAL LOW (ref 0.450–4.500)

## 2024-10-03 MED ORDER — LEVOTHYROXINE SODIUM 150 MCG PO TABS: 150.0000 ug | ORAL_TABLET | Freq: Every day | ORAL | 0 refills | Status: DC

## 2024-10-03 NOTE — Assessment & Plan Note (Signed)
 Gastroesophageal reflux disease (GERD) GERD managed with Protonix. - Continue Protonix 40 mg daily.

## 2024-10-03 NOTE — Assessment & Plan Note (Signed)
  Essential hypertension Hypertension well-controlled with current medication regimen. - Continue benazepril 20 mg daily. - Continue hydrochlorothiazide 12.5 mg daily.

## 2024-10-03 NOTE — Telephone Encounter (Signed)
 Copied from CRM [id number]. Topic: Clinical - Lab/Test Results >> Oct 03, 2024 12:48 PM Avram MATSU wrote: Reason for CRM: I relayed the test result to the patient, she stated she feels fine and will be getting eye drops for her eyes.

## 2024-10-03 NOTE — Assessment & Plan Note (Signed)
 Possibly secondary to chronic HTN Will continue to monitor Patient to avoid NSAIDs and nephrotoxic medications

## 2024-10-03 NOTE — Assessment & Plan Note (Signed)
 Hypothyroidism Well-managed with current levothyroxine dosage. Recent TSH levels within normal range. - Continue levothyroxine 175 mcg daily.

## 2024-10-14 ENCOUNTER — Ambulatory Visit (INDEPENDENT_AMBULATORY_CARE_PROVIDER_SITE_OTHER)
Admission: RE | Admit: 2024-10-14 | Discharge: 2024-10-14 | Disposition: A | Source: Ambulatory Visit | Admitting: Radiology

## 2024-10-14 ENCOUNTER — Ambulatory Visit (INDEPENDENT_AMBULATORY_CARE_PROVIDER_SITE_OTHER)
Admission: RE | Admit: 2024-10-14 | Discharge: 2024-10-14 | Disposition: A | Discharge status: Home / Self Care | Source: Ambulatory Visit

## 2024-10-14 DIAGNOSIS — Z1231 Encounter for screening mammogram for malignant neoplasm of breast: Secondary | ICD-10-CM | POA: Diagnosis not present

## 2024-10-14 DIAGNOSIS — E2839 Other primary ovarian failure: Secondary | ICD-10-CM | POA: Diagnosis not present

## 2024-11-11 ENCOUNTER — Ambulatory Visit: Admitting: Podiatry

## 2024-11-11 DIAGNOSIS — L600 Ingrowing nail: Secondary | ICD-10-CM

## 2024-11-11 MED ORDER — AMOXICILLIN-POT CLAVULANATE 875-125 MG PO TABS: 1.0000 | ORAL_TABLET | Freq: Two times a day (BID) | ORAL | 0 refills | Status: AC

## 2024-11-11 NOTE — Patient Instructions (Signed)

## 2024-11-11 NOTE — Progress Notes (Signed)
 Patient complains of painful ingrown left lateral hallux.  Has some purulent drainage from it which is drained.  That was several days ago.. Patient denies fevers, chills, nausea, vomiting.  Objective:  Vitals: Reviewed  General: Well developed, nourished, in no acute distress, alert and oriented x3   Vascular: DP pulse 2/4 bilateral. PT pulse 2/4 bilateral.  Slight edema toe with ingrown nail.  Capillary refill time immediate bilaterally  Dermatology: Erythema, edema, incurvated nail border lateral border hallux left with serous drainage . Tenderness present with palpation. Normal skin tone and texture feet with normal hair growth.  Neurological: Grossly intact. Normal reflexes.   Musculoskeletal: Tenderness with palpation of the distal hallux left. No tenderness or painful ROM at IPJ.  Diagnosis: Ingrown nail lateral border hallux left  Plan: -discussed etiology and treatment of ingrown nails. Discussed surgical vs conservative treatment. -Consent signed for appropriate matrixectomy affected nail(s). -Rx: Augmentin  875 mg, 1 p.o. twice daily for 7 days  Procedure(s):   - Matrixectomy(s) lateral border hallux left: Toe anesthetized with 3cc 2:1 mixture 2% Lidocaine  with epinephrine: Sodium Bicarbonate. Surgical site prepped. Digital tourniquet applied.  Avulsion of nail border. performed. Matrixecomy performed with three 30 second applications of phenol to nail matrix. Site irrigated with alcohol .  Tourniquet released with good vascularity noticed in digit.  Applied triple antibiotic to nailbed and applied gauze and Coban dressing. - Written and oral postoperative instructions given.  -Return for post-op 2 weeks.  JINNY Prentice Binder, DPM

## 2024-11-18 ENCOUNTER — Ambulatory Visit: Admitting: Podiatry

## 2024-11-28 ENCOUNTER — Encounter: Payer: Self-pay | Admitting: Podiatry

## 2024-11-28 ENCOUNTER — Ambulatory Visit: Admitting: Podiatry

## 2024-11-28 DIAGNOSIS — L600 Ingrowing nail: Secondary | ICD-10-CM

## 2024-11-28 NOTE — Progress Notes (Signed)
 Patient presents follow-up nail surgery toe.  No complaints.  Physical exam:  Dermatologic: Nail surgery site for matrixectomy healing well with no signs of infection.  Diagnosis: 1.  Status post matrixectomy toe.  Healing well  Plan: - POV status post nail surgery , if patient has any problems she can call for appointment otherwise we can see her as needed  - Return as needed

## 2024-12-01 ENCOUNTER — Other Ambulatory Visit: Payer: Self-pay

## 2024-12-02 ENCOUNTER — Ambulatory Visit: Admitting: Podiatry

## 2024-12-16 ENCOUNTER — Ambulatory Visit: Admitting: Podiatry

## 2024-12-16 ENCOUNTER — Encounter: Payer: Self-pay | Admitting: Podiatry

## 2024-12-16 DIAGNOSIS — B351 Tinea unguium: Secondary | ICD-10-CM | POA: Diagnosis not present

## 2024-12-16 DIAGNOSIS — M79674 Pain in right toe(s): Secondary | ICD-10-CM

## 2024-12-16 DIAGNOSIS — I739 Peripheral vascular disease, unspecified: Secondary | ICD-10-CM

## 2024-12-16 DIAGNOSIS — M79675 Pain in left toe(s): Secondary | ICD-10-CM

## 2024-12-16 NOTE — Progress Notes (Unsigned)
 Nails Asymptomatic mucoid cyst right 3rd toe dipj

## 2025-01-05 ENCOUNTER — Ambulatory Visit

## 2025-03-17 ENCOUNTER — Ambulatory Visit: Admitting: Podiatry
